# Patient Record
Sex: Female | Born: 1985 | Race: Asian | Hispanic: No | Marital: Single | Smoking: Never smoker
Health system: Southern US, Community
[De-identification: ages and names within clinical notes are randomized; demographics above are authoritative.]

## PROBLEM LIST (undated history)

## (undated) ENCOUNTER — Inpatient Hospital Stay (HOSPITAL_COMMUNITY): Payer: 59

## (undated) ENCOUNTER — Inpatient Hospital Stay (HOSPITAL_COMMUNITY): Payer: Self-pay

## (undated) DIAGNOSIS — Z789 Other specified health status: Secondary | ICD-10-CM

## (undated) HISTORY — PX: ABDOMINAL HYSTERECTOMY: SHX81

---

## 2010-12-13 ENCOUNTER — Encounter: Payer: Self-pay | Admitting: Obstetrics and Gynecology

## 2011-03-21 ENCOUNTER — Inpatient Hospital Stay (HOSPITAL_COMMUNITY)
Admission: AD | Admit: 2011-03-21 | Discharge: 2011-03-25 | DRG: 766 | Disposition: A | Discharge status: Home / Self Care | Payer: Medicaid Other | Source: Ambulatory Visit | Attending: Obstetrics and Gynecology | Admitting: Obstetrics and Gynecology

## 2011-03-21 ENCOUNTER — Encounter (HOSPITAL_COMMUNITY): Payer: Self-pay | Admitting: Obstetrics and Gynecology

## 2011-03-21 DIAGNOSIS — O480 Post-term pregnancy: Principal | ICD-10-CM | POA: Diagnosis present

## 2011-03-21 LAB — CBC
HCT: 39.2 % (ref 36.0–46.0)
Hemoglobin: 13.1 g/dL (ref 12.0–15.0)
MCH: 25.8 pg — ABNORMAL LOW (ref 26.0–34.0)
MCHC: 33.4 g/dL (ref 30.0–36.0)
RBC: 5.07 MIL/uL (ref 3.87–5.11)
RDW: 17 % — ABNORMAL HIGH (ref 11.5–15.5)

## 2011-03-23 LAB — CBC
MCH: 25.5 pg — ABNORMAL LOW (ref 26.0–34.0)
MCHC: 31.7 g/dL (ref 30.0–36.0)
MCV: 80.6 fL (ref 78.0–100.0)
Platelets: 238 10*3/uL (ref 150–400)
RDW: 16.7 % — ABNORMAL HIGH (ref 11.5–15.5)
WBC: 14.9 10*3/uL — ABNORMAL HIGH (ref 4.0–10.5)

## 2011-03-23 LAB — ABO/RH: ABO/RH(D): B NEG

## 2011-04-14 NOTE — Op Note (Signed)
NAME:  Kelly Patel, ZUCCARO NO.:  [id number]  MEDICAL RECORD NO.:  [id number]           PATIENT TYPE:  I  LOCATION:  9106                          FACILITY:  [facility]  PHYSICIAN:  Arlyce Harman, MD     DATE OF BIRTH:  Jul 06, 1986  DATE OF PROCEDURE:  03/22/2011 DATE OF DISCHARGE:                              OPERATIVE REPORT   PROCEDURE:  Primary low-transverse cervical cesarean section.  PREOPERATIVE DIAGNOSES:  41 plus week pregnancy, failed induction due to failure to progress and nonreassuring fetal heart rate tracing.  POSTOPERATIVE DIAGNOSES:  41 plus week pregnancy, failed induction due to failure to progress and nonreassuring fetal heart rate tracing plus nuchal cord x1 and persistent occiput posterior.  SURGEON:  Arlyce Harman, MD  ASSISTANT:  No assistant.  ESTIMATED BLOOD LOSS:  1000 mL.  INDICATIONS AND CONSENT:  The patient is a 25 year old gravida 1, para 0, who was due on March 14, 2011.  Prenatal course has been uncomplicated. The patient was brought in for induction due to postdates.  Cervidil was administered on Sunday night and this was followed by high dose Pitocin augmentation.  The following morning, labor progressed to 6 cm and then persistent severe variable to late decelerations were noted. The Pitocin was turned off, oxygen administered, hydration and positioning to the side was performed and an amnio infusion was started and the tracing became normal.  After an adequate period of time, the Pitocin was restarted and initially the tracing was reassuring, but after about 2 hours of labor, the decelerations reoccurred, and were persistent and severe.  Cervical check revealed no cervical change and there was a thickening of the anterior lip that was not was not noted previously.  Because of the failure to progress as well as a nonreassuring fetal heart rate tracing, a cesarean section was elected and the patient and mother were informed of  the indications and consent was given.  PROCEDURE:  The patient was placed on the table in a supine position with wedging after an adequate level of epidural anesthesia had been obtained.  The abdomen was sterilely prepped and draped in the usual fashion and a Pfannenstiel incision was made into the skin and extended through the abdominal layers without difficulty.  The uterus was entered in a low transverse cervical fashion.  The membranes were brought forth and ruptured.  The fluid was clear.  Then, the infant's head was delivered.  It was in a persistent occiput posterior position.  The infant's head was delivered and the nuchal cord x1 was released.  The nose and mouth was suctioned out and the remainder of the infant was delivered without difficulty.  It was a female with Apgars of 8 and 9.  Weight was later found to be 6 pounds 13 ounces.  After the cord had been clamped and cut, the infant was handed off to the nurse and the placenta was manually removed after a tube of blood was obtained for typing because of the mother's Rh negative status.  After the placenta had been removed, the uterus was sponged out and the uterus was closed with  0 Vicryl in a running locked fashion.  The second layer was that of a modified Lambert stitch.  Hemostasis was excellent.  The abdomen was irrigated and suctioned out and the peritoneum was closed with chromic in a running fashion.  The fascia was closed with 0 Vicryl in a running locked fashion.  The incision was then irrigated and then sponged out. The subcuticular layer was closed with 2-0 plain in a running fashion. The skin was approximated using a Mellody Dance needle and a 4-0 Vicryl suture in a subcuticular fashion and the incision was covered with Steri-Strips and a pressure dressing.  The patient was taken off the table and taken to recovery in good condition.  The uterus was somewhat boggy, so Pitocin was given and then postpartum 0.2 mg of IM  Methergine was given with good response.  ESTIMATED BLOOD LOSS:  1000 mL.  SPECIMEN:  Placenta.  DISPOSITION:  Sent to Labor and Delivery.  COMPLICATIONS:  None.     Arlyce Harman, MD     EG/MEDQ  D:  03/23/2011  T:  03/23/2011  Job:  [id number]  Electronically Signed by Arlyce Harman MD on 04/14/2011 84:13:24 PM

## 2011-04-14 NOTE — Discharge Summary (Signed)
  NAME:  Kelly Patel, Kelly Patel NO.:  [id number]  MEDICAL RECORD NO.:  [id number]           PATIENT TYPE:  I  LOCATION:  9106                          [facility]ACILITY:  [facility]  PHYSICIAN:  Arlyce Harman, MD     DATE OF BIRTH:  [birth year]  DATE OF ADMISSION:  03/21/2011 DATE OF DISCHARGE:  03/25/2011                              DISCHARGE SUMMARY   HOSPITAL COURSE:  The patient was 41+ weeks and was admitted for induction due to post dates.  Cervidil was instilled in the vagina overnight and Pitocin induction was started the following morning. Despite adequate labor with Pitocin, the cervix failed to progress past 6 cm.  In addition, she had a couple of episodes of severe decelerations of the fetal heart rate tracing, the first resolved with Aminofusion hydration, oxygen, and positioning.  When she had the second severe deceleration, the decision was made to examine the cervix and the cervix had not changed in over 3 hours.  Therefore, cesarean section was done due to failure to progress and nonreassuring fetal heart rate tracing. She underwent low-transverse cervical cesarean section on March 22, 2011, around 11:33 p.m. and delivered a viable female infant with good Apgars.  There was a nuchal cord x1 and a persistent occiput posterior. Postoperative course has been uncomplicated and the patient is being discharged on the third postop day in good condition and will be followed in our office in 2 weeks to check her incision.  Discharge medications include Percocet 5 mg, she has 30 tablets, she will take 1-2 every 4-6 hours as needed for pain and if not needed, she can use Motrin instead over the counter.  She has a prescription for ferrous sulfate 325 mg p.o. b.i.d. and prenatal vitamins once a day.  FINAL DIAGNOSES: 1. Failed induction. 2. Failure to progress. 3. Nonreassuring fetal heart rate tracing.  PERTINENT LABORATORY DATA:  Hemoglobin on Mar 23, 2011, is 10.1.  She  is Rh negative.  SIGNIFICANT FINDINGS:  None.  PROCEDURES PERFORMED:  Failed induction and primary low-transverse cervical cesarean section.  CONDITION OF THE PATIENT ON DISCHARGE:  Excellent.  She is afebrile and ambulatory without complaints.  INSTRUCTIONS GIVEN TO THE PATIENT:  Come to office in 2 weeks for incision check.  No heavy lifting heavier than the baby.  Call office for temperature of 101, excessive bleeding, or severe pain.  No coitus until after 6 weeks.  DIET:  Regular.     Arlyce Harman, MD     EG/MEDQ  D:  03/25/2011  T:  03/25/2011  Job:  [id number]  Electronically Signed by Arlyce Harman MD on 04/14/2011 06:21:27 PM

## 2011-10-11 ENCOUNTER — Other Ambulatory Visit (HOSPITAL_COMMUNITY)
Admission: RE | Admit: 2011-10-11 | Discharge: 2011-10-11 | Disposition: A | Discharge status: Home / Self Care | Payer: Medicaid Other | Source: Ambulatory Visit | Attending: Obstetrics and Gynecology | Admitting: Obstetrics and Gynecology

## 2011-10-11 DIAGNOSIS — Z124 Encounter for screening for malignant neoplasm of cervix: Secondary | ICD-10-CM | POA: Insufficient documentation

## 2014-09-23 ENCOUNTER — Encounter (HOSPITAL_COMMUNITY): Payer: Self-pay | Admitting: Obstetrics and Gynecology

## 2016-08-24 ENCOUNTER — Inpatient Hospital Stay (HOSPITAL_COMMUNITY): Payer: Medicaid Other

## 2016-08-24 ENCOUNTER — Encounter (HOSPITAL_COMMUNITY): Payer: Self-pay | Admitting: *Deleted

## 2016-08-24 ENCOUNTER — Inpatient Hospital Stay (HOSPITAL_COMMUNITY)
Admission: AD | Admit: 2016-08-24 | Discharge: 2016-08-24 | Disposition: A | Discharge status: Home / Self Care | Payer: Medicaid Other | Source: Ambulatory Visit | Attending: Obstetrics and Gynecology | Admitting: Obstetrics and Gynecology

## 2016-08-24 DIAGNOSIS — B3731 Acute candidiasis of vulva and vagina: Secondary | ICD-10-CM

## 2016-08-24 DIAGNOSIS — Z3A01 Less than 8 weeks gestation of pregnancy: Secondary | ICD-10-CM | POA: Insufficient documentation

## 2016-08-24 DIAGNOSIS — R300 Dysuria: Secondary | ICD-10-CM | POA: Diagnosis present

## 2016-08-24 DIAGNOSIS — O2341 Unspecified infection of urinary tract in pregnancy, first trimester: Secondary | ICD-10-CM | POA: Diagnosis not present

## 2016-08-24 DIAGNOSIS — R109 Unspecified abdominal pain: Secondary | ICD-10-CM

## 2016-08-24 DIAGNOSIS — O219 Vomiting of pregnancy, unspecified: Secondary | ICD-10-CM

## 2016-08-24 DIAGNOSIS — O26899 Other specified pregnancy related conditions, unspecified trimester: Secondary | ICD-10-CM

## 2016-08-24 DIAGNOSIS — O208 Other hemorrhage in early pregnancy: Secondary | ICD-10-CM | POA: Insufficient documentation

## 2016-08-24 DIAGNOSIS — B373 Candidiasis of vulva and vagina: Secondary | ICD-10-CM | POA: Diagnosis not present

## 2016-08-24 DIAGNOSIS — O98811 Other maternal infectious and parasitic diseases complicating pregnancy, first trimester: Secondary | ICD-10-CM | POA: Diagnosis not present

## 2016-08-24 DIAGNOSIS — O418X10 Other specified disorders of amniotic fluid and membranes, first trimester, not applicable or unspecified: Secondary | ICD-10-CM

## 2016-08-24 DIAGNOSIS — Z3491 Encounter for supervision of normal pregnancy, unspecified, first trimester: Secondary | ICD-10-CM

## 2016-08-24 DIAGNOSIS — O468X1 Other antepartum hemorrhage, first trimester: Secondary | ICD-10-CM

## 2016-08-24 HISTORY — DX: Other specified health status: Z78.9

## 2016-08-24 LAB — CBC
HCT: 38.7 % (ref 36.0–46.0)
Hemoglobin: 14.1 g/dL (ref 12.0–15.0)
MCH: 29.4 pg (ref 26.0–34.0)
MCHC: 36.4 g/dL — ABNORMAL HIGH (ref 30.0–36.0)
MCV: 80.8 fL (ref 78.0–100.0)
Platelets: 204 10*3/uL (ref 150–400)
RBC: 4.79 MIL/uL (ref 3.87–5.11)
RDW: 13.3 % (ref 11.5–15.5)
WBC: 8.3 10*3/uL (ref 4.0–10.5)

## 2016-08-24 LAB — URINALYSIS, ROUTINE W REFLEX MICROSCOPIC
Bilirubin Urine: NEGATIVE
Glucose, UA: NEGATIVE mg/dL
KETONES UR: NEGATIVE mg/dL
Nitrite: POSITIVE — AB
PROTEIN: NEGATIVE mg/dL
Specific Gravity, Urine: 1.01 (ref 1.005–1.030)
pH: 6.5 (ref 5.0–8.0)

## 2016-08-24 LAB — URINE MICROSCOPIC-ADD ON

## 2016-08-24 LAB — WET PREP, GENITAL
CLUE CELLS WET PREP: NONE SEEN
SPERM: NONE SEEN
TRICH WET PREP: NONE SEEN

## 2016-08-24 LAB — HCG, QUANTITATIVE, PREGNANCY: hCG, Beta Chain, Quant, S: 39902 m[IU]/mL — ABNORMAL HIGH (ref <5)

## 2016-08-24 LAB — POCT PREGNANCY, URINE: Preg Test, Ur: POSITIVE — AB

## 2016-08-24 MED ORDER — CEPHALEXIN 500 MG PO CAPS: 500.0000 mg | ORAL_CAPSULE | Freq: Four times a day (QID) | ORAL | 0 refills | Status: DC

## 2016-08-24 MED ORDER — PROMETHAZINE HCL 12.5 MG PO TABS: 12.5000 mg | ORAL_TABLET | Freq: Four times a day (QID) | ORAL | 0 refills | Status: DC | PRN

## 2016-08-24 MED ORDER — TERCONAZOLE 0.8 % VA CREA: 1.0000 | TOPICAL_CREAM | Freq: Every day | VAGINAL | 0 refills | Status: DC

## 2016-08-24 NOTE — MAU Provider Note (Signed)
History     CSN: [id number]  Arrival date and time: 08/24/16 1307   First Provider Initiated Contact with Patient 08/24/16 1653       Chief Complaint  Patient presents with  . Emesis  . Dysuria   HPI Kelly Patel is a 30 y.o. G2P1001 at [redacted]w[redacted]d by LMP who presents with dysuria, abdominal cramping, & n/v. Found out she was pregnant last month; does not know where she will go for prenatal care.  Symptoms began over the weekend with abdominal cramping & dysuria. Rates pain 4/10. Has not treated. Nothing makes better or worse. Denies hematuria, frequency, fever/chills, flank pain, or vaginal bleeding. Has had nausea & vomiting throughout pregnancy. Has vomited twice today. No heartburn, diarrhea, or constipation.  OB History    Gravida Para Term Preterm AB Living   2 1 1     1    SAB TAB Ectopic Multiple Live Births           1      Past Medical History:  Diagnosis Date  . Medical history non-contributory     Past Surgical History:  Procedure Laterality Date  . CESAREAN SECTION      History reviewed. No pertinent family history.  Social History  Substance Use Topics  . Smoking status: Never Smoker  . Smokeless tobacco: Never Used  . Alcohol use No    Allergies: No Known Allergies  Prescriptions Prior to Admission  Medication Sig Dispense Refill Last Dose  . OVER THE COUNTER MEDICATION Take 1 tablet by mouth daily as needed (nausea). Nausea medication   08/23/2016 at Unknown time    Review of Systems  Constitutional: Negative.   Gastrointestinal: Positive for abdominal pain, nausea and vomiting. Negative for constipation and diarrhea.  Genitourinary: Positive for dysuria. Negative for flank pain, frequency and hematuria.       No vaginal bleeding   Physical Exam   Blood pressure 104/57, pulse 66, temperature 98.5 F (36.9 C), temperature source Oral, resp. rate 16, height 5' (1.524 m), weight 152 lb (68.9 kg), last menstrual period 07/19/2016, unknown if currently  breastfeeding.  Physical Exam  Nursing note and vitals reviewed. Constitutional: She is oriented to person, place, and time. She appears well-developed and well-nourished. No distress.  HENT:  Head: Normocephalic and atraumatic.  Eyes: Conjunctivae are normal. Right eye exhibits no discharge. Left eye exhibits no discharge. No scleral icterus.  Neck: Normal range of motion.  Cardiovascular: Normal rate.   Respiratory: Effort normal. No respiratory distress.  GI: Soft. She exhibits no distension. There is no tenderness. There is no rebound, no guarding and no CVA tenderness.  Genitourinary: Uterus is enlarged. Cervix exhibits no motion tenderness. Right adnexum displays no mass and no tenderness. Left adnexum displays no mass and no tenderness.  Genitourinary Comments: Cervix closed  Neurological: She is alert and oriented to person, place, and time.  Skin: Skin is warm and dry. She is not diaphoretic.  Psychiatric: She has a normal mood and affect. Her behavior is normal. Judgment and thought content normal.    MAU Course  Procedures Results for orders placed or performed during the hospital encounter of 08/24/16 (from the past 24 hour(s))  Urinalysis, Routine w reflex microscopic (not at [facility])     Status: Abnormal   Collection Time: 08/24/16  1:31 PM  Result Value Ref Range   Color, Urine YELLOW YELLOW   APPearance CLEAR CLEAR   Specific Gravity, Urine 1.010 1.005 - 1.030   pH 6.5 5.0 -  8.0   Glucose, UA NEGATIVE NEGATIVE mg/dL   Hgb urine dipstick SMALL (A) NEGATIVE   Bilirubin Urine NEGATIVE NEGATIVE   Ketones, ur NEGATIVE NEGATIVE mg/dL   Protein, ur NEGATIVE NEGATIVE mg/dL   Nitrite POSITIVE (A) NEGATIVE   Leukocytes, UA SMALL (A) NEGATIVE  Urine microscopic-add on     Status: Abnormal   Collection Time: 08/24/16  1:31 PM  Result Value Ref Range   Squamous Epithelial / LPF 0-5 (A) NONE SEEN   WBC, UA 0-5 0 - 5 WBC/hpf   RBC / HPF 0-5 0 - 5 RBC/hpf   Bacteria, UA MANY  (A) NONE SEEN   Urine-Other MUCOUS PRESENT   Pregnancy, urine POC     Status: Abnormal   Collection Time: 08/24/16  2:08 PM  Result Value Ref Range   Preg Test, Ur POSITIVE (A) NEGATIVE  CBC     Status: Abnormal   Collection Time: 08/24/16  3:04 PM  Result Value Ref Range   WBC 8.3 4.0 - 10.5 K/uL   RBC 4.79 3.87 - 5.11 MIL/uL   Hemoglobin 14.1 12.0 - 15.0 g/dL   HCT 09.838.7 11.936.0 - 14.746.0 %   MCV 80.8 78.0 - 100.0 fL   MCH 29.4 26.0 - 34.0 pg   MCHC 36.4 (H) 30.0 - 36.0 g/dL   RDW 82.913.3 56.211.5 - 13.015.5 %   Platelets 204 150 - 400 K/uL  hCG, quantitative, pregnancy     Status: Abnormal   Collection Time: 08/24/16  3:04 PM  Result Value Ref Range   hCG, Beta Chain, Quant, S 39,902 (H) <5 mIU/mL  Wet prep, genital     Status: Abnormal   Collection Time: 08/24/16  5:00 PM  Result Value Ref Range   Yeast Wet Prep HPF POC PRESENT (A) NONE SEEN   Trich, Wet Prep NONE SEEN NONE SEEN   Clue Cells Wet Prep HPF POC NONE SEEN NONE SEEN   WBC, Wet Prep HPF POC MANY (A) NONE SEEN   Sperm NONE SEEN    [place] Ob Comp Less 14 Wks  Result Date: 08/24/2016 CLINICAL DATA:  Abdominal pain for 3 days. Quantitative beta HCG [id number]. EXAM: OBSTETRIC <14 WK US AND TRANSVAGINAL OB US TECHNIQUE: Both transabdominal and transvaginal ultrasound examinations were performed for complete evaluation of the gestation as well as the maternal uterus, adnexal regions, and pelvic cul-de-sac. Transvaginal technique was performed to assess early pregnancy. COMPARISON:  None. FINDINGS: Intrauterine gestational sac: Present Yolk sac:  Present Embryo:  Present Cardiac Activity: Present Heart Rate: 103  bpm CRL:  3.0 mm  5 w   6 d                  US EDC: 04/20/2017 Subchorionic hemorrhage:  There is a small subchorionic hemorrhage. Maternal uterus/adnexae: Normal appearance of bilateral ovaries. IMPRESSION: Single live intrauterine pregnancy corresponding to 5 weeks 6 days gestation. Small subchorionic hemorrhage. Embryonic bradycardia.   Close clinical follow-up is recommended. Electronically Signed   By: Ted Mcalpineobrinka  Dimitrova M.D.   On: 08/24/2016 17:45   [place] Ob Transvaginal  Result Date: 08/24/2016 CLINICAL DATA:  Abdominal pain for 3 days. Quantitative beta HCG [id number]. EXAM: OBSTETRIC <14 WK US AND TRANSVAGINAL OB US TECHNIQUE: Both transabdominal and transvaginal ultrasound examinations were performed for complete evaluation of the gestation as well as the maternal uterus, adnexal regions, and pelvic cul-de-sac. Transvaginal technique was performed to assess early pregnancy. COMPARISON:  None. FINDINGS: Intrauterine gestational sac: Present Yolk sac:  Present Embryo:  Present Cardiac Activity: Present Heart Rate: 103  bpm CRL:  3.0 mm  5 w   6 d                  [place] EDC: 04/20/2017 Subchorionic hemorrhage:  There is a small subchorionic hemorrhage. Maternal uterus/adnexae: Normal appearance of bilateral ovaries. IMPRESSION: Single live intrauterine pregnancy corresponding to 5 weeks 6 days gestation. Small subchorionic hemorrhage. Embryonic bradycardia.  Close clinical follow-up is recommended. Electronically Signed   By: Ted Mcalpine M.D.   On: 08/24/2016 17:45    MDM +UPT UA, wet prep, GC/chlamydia, CBC, ABO/Rh, quant hCG, HIV, and [place] today to rule out ectopic pregnancy U/a +nitrites -- will tx for uti & send urine for culture Ultrasound shows SIUP at [redacted]w[redacted]d c/w LMP with cardiac activity and small [facility] Wet prep +yeast Assessment and Plan  A: 1. Urinary tract infection in mother during first trimester of pregnancy   2. Abdominal pain affecting pregnancy   3. Vaginal yeast infection   4. Normal IUP (intrauterine pregnancy) on prenatal ultrasound, first trimester   5. Subchorionic hematoma in first trimester, single or unspecified fetus   6. Nausea and vomiting during pregnancy prior to [redacted] weeks gestation     P: Discharge home Rx phenergan, keflex, & terazol Urine culture pending GC/CT pending Discussed reasons to  return to MAU Start prenatal care   Judeth Horn 08/24/2016, 4:52 PM

## 2016-08-24 NOTE — Discharge Instructions (Signed)
Subchorionic Hematoma A subchorionic hematoma is a gathering of blood between the outer wall of the placenta and the inner wall of the womb (uterus). The placenta is the organ that connects the fetus to the wall of the uterus. The placenta performs the feeding, breathing (oxygen to the fetus), and waste removal (excretory work) of the fetus.  Subchorionic hematoma is the most common abnormality found on a result from ultrasonography done during the first trimester or early second trimester of pregnancy. If there has been little or no vaginal bleeding, early small hematomas usually shrink on their own and do not affect your baby or pregnancy. The blood is gradually absorbed over 1-2 weeks. When bleeding starts later in pregnancy or the hematoma is larger or occurs in an older pregnant woman, the outcome may not be as good. Larger hematomas may get bigger, which increases the chances for miscarriage. Subchorionic hematoma also increases the risk of premature detachment of the placenta from the uterus, preterm (premature) labor, and stillbirth. HOME CARE INSTRUCTIONS  Stay on bed rest if your health care provider recommends this. Although bed rest will not prevent more bleeding or prevent a miscarriage, your health care provider may recommend bed rest until you are advised otherwise.  Avoid heavy lifting (more than 10 lb [4.5 kg]), exercise, sexual intercourse, or douching as directed by your health care provider.  Keep track of the number of pads you use each day and how soaked (saturated) they are. Write down this information.  Do not use tampons.  Keep all follow-up appointments as directed by your health care provider. Your health care provider may ask you to have follow-up blood tests or ultrasound tests or both. SEEK IMMEDIATE MEDICAL CARE IF:  You have severe cramps in your stomach, back, abdomen, or pelvis.  You have a fever.  You pass large clots or tissue. Save any tissue for your health  care provider to look at.  Your bleeding increases or you become lightheaded, feel weak, or have fainting episodes.   This information is not intended to replace advice given to you by your health care provider. Make sure you discuss any questions you have with your health care provider.   Document Released: 02/23/2007 Document Revised: 11/29/2014 Document Reviewed: 06/07/2013 Elsevier Interactive Patient Education 2016 [employer] Monilial Vaginitis Vaginitis in a soreness, swelling and redness (inflammation) of the vagina and vulva. Monilial vaginitis is not a sexually transmitted infection. CAUSES  Yeast vaginitis is caused by yeast (candida) that is normally found in your vagina. With a yeast infection, the candida has overgrown in number to a point that upsets the chemical balance. SYMPTOMS   White, thick vaginal discharge.  Swelling, itching, redness and irritation of the vagina and possibly the lips of the vagina (vulva).  Burning or painful urination.  Painful intercourse. DIAGNOSIS  Things that may contribute to monilial vaginitis are:  Postmenopausal and virginal states.  Pregnancy.  Infections.  Being tired, sick or stressed, especially if you had monilial vaginitis in the past.  Diabetes. Good control will help lower the chance.  Birth control pills.  Tight fitting garments.  Using bubble bath, feminine sprays, douches or deodorant tampons.  Taking certain medications that kill germs (antibiotics).  Sporadic recurrence can occur if you become ill. TREATMENT  Your caregiver will give you medication.  There are several kinds of anti monilial vaginal creams and suppositories specific for monilial vaginitis. For recurrent yeast infections, use a suppository or cream in the vagina 2 times a  week, or as directed.  Anti-monilial or steroid cream for the itching or irritation of the vulva may also be used. Get your caregiver's permission.  Painting the vagina  with methylene blue solution may help if the monilial cream does not work.  Eating yogurt may help prevent monilial vaginitis. HOME CARE INSTRUCTIONS   Finish all medication as prescribed.  Do not have sex until treatment is completed or after your caregiver tells you it is okay.  Take warm sitz baths.  Do not douche.  Do not use tampons, especially scented ones.  Wear cotton underwear.  Avoid tight pants and panty hose.  Tell your sexual partner that you have a yeast infection. They should go to their caregiver if they have symptoms such as mild rash or itching.  Your sexual partner should be treated as well if your infection is difficult to eliminate.  Practice safer sex. Use condoms.  Some vaginal medications cause latex condoms to fail. Vaginal medications that harm condoms are:  Cleocin cream.  Butoconazole (Femstat).  Terconazole (Terazol) vaginal suppository.  Miconazole (Monistat) (may be purchased over the counter). SEEK MEDICAL CARE IF:   You have a temperature by mouth above 102 F (38.9 C).  The infection is getting worse after 2 days of treatment.  The infection is not getting better after 3 days of treatment.  You develop blisters in or around your vagina.  You develop vaginal bleeding, and it is not your menstrual period.  You have pain when you urinate.  You develop intestinal problems.  You have pain with sexual intercourse.   This information is not intended to replace advice given to you by your health care provider. Make sure you discuss any questions you have with your health care provider.   Document Released: 08/18/2005 Document Revised: 01/31/2012 Document Reviewed: 05/12/2015 Elsevier Interactive Patient Education 2016 [employer] Inc[employer] Pregnancy and Urinary Tract Infection A urinary tract infection (UTI) is a bacterial infection of the urinary tract. Infection of the urinary tract can include the ureters, kidneys (pyelonephritis),  bladder (cystitis), and urethra (urethritis). All pregnant women should be screened for bacteria in the urinary tract. Identifying and treating a UTI will decrease the risk of preterm labor and developing more serious infections in both the mother and baby. CAUSES Bacteria germs cause almost all UTIs.  RISK FACTORS Many factors can increase your chances of getting a UTI during pregnancy. These include:  Having a short urethra.  Poor toilet and hygiene habits.  Sexual intercourse.  Blockage of urine along the urinary tract.  Problems with the pelvic muscles or nerves.  Diabetes.  Obesity.  Bladder problems after having several children.  Previous history of UTI. SIGNS AND SYMPTOMS   Pain, burning, or a stinging feeling when urinating.  Suddenly feeling the need to urinate right away (urgency).  Loss of bladder control (urinary incontinence).  Frequent urination, more than is common with pregnancy.  Lower abdominal or back discomfort.  Cloudy urine.  Blood in the urine (hematuria).  Fever. When the kidneys are infected, the symptoms may be:  Back pain.  Flank pain on the right side more so than the left.  Fever.  Chills.  Nausea.  Vomiting. DIAGNOSIS  A urinary tract infection is usually diagnosed through urine tests. Additional tests and procedures are sometimes done. These may include:  Ultrasound exam of the kidneys, ureters, bladder, and urethra.  Looking in the bladder with a lighted tube (cystoscopy). TREATMENT Typically, UTIs can be treated with antibiotic medicines.  HOME CARE INSTRUCTIONS   Only take over-the-counter or prescription medicines as directed by your health care provider. If you were prescribed antibiotics, take them as directed. Finish them even if you start to feel better.  Drink enough fluids to keep your urine clear or pale yellow.  Do not have sexual intercourse until the infection is gone and your health care provider says  it is okay.  Make sure you are tested for UTIs throughout your pregnancy. These infections often come back. Preventing a UTI in the Future  Practice good toilet habits. Always wipe from front to back. Use the tissue only once.  Do not hold your urine. Empty your bladder as soon as possible when the urge comes.  Do not douche or use deodorant sprays.  Wash with soap and warm water around the genital area and the anus.  Empty your bladder before and after sexual intercourse.  Wear underwear with a cotton crotch.  Avoid caffeine and carbonated drinks. They can irritate the bladder.  Drink cranberry juice or take cranberry pills. This may decrease the risk of getting a UTI.  Do not drink alcohol.  Keep all your appointments and tests as scheduled. SEEK MEDICAL CARE IF:   Your symptoms get worse.  You are still having fevers 2 or more days after treatment begins.  You have a rash.  You feel that you are having problems with medicines prescribed.  You have abnormal vaginal discharge. SEEK IMMEDIATE MEDICAL CARE IF:   You have back or flank pain.  You have chills.  You have blood in your urine.  You have nausea and vomiting.  You have contractions of your uterus.  You have a gush of fluid from the vagina. MAKE SURE YOU:  Understand these instructions.   Will watch your condition.   Will get help right away if you are not doing well or get worse.    This information is not intended to replace advice given to you by your health care provider. Make sure you discuss any questions you have with your health care provider.   Document Released: 03/05/2011 Document Revised: 08/29/2013 Document Reviewed: 06/07/2013 Elsevier Interactive Patient Education [employer]

## 2016-08-24 NOTE — MAU Note (Signed)
Pt C/O N&V for the last week, [place] a clinic [place].  Has mild lower abd pain today, denies bleeding. Has burning & itching with urination.

## 2016-08-25 LAB — GC/CHLAMYDIA PROBE AMP (~~LOC~~) NOT AT ARMC
Chlamydia: NEGATIVE
Neisseria Gonorrhea: NEGATIVE

## 2016-08-26 LAB — CULTURE, OB URINE

## 2017-06-29 ENCOUNTER — Encounter (HOSPITAL_COMMUNITY): Payer: Self-pay

## 2020-04-18 ENCOUNTER — Inpatient Hospital Stay (HOSPITAL_BASED_OUTPATIENT_CLINIC_OR_DEPARTMENT_OTHER)
Admission: EM | Admit: 2020-04-18 | Discharge: 2020-04-24 | DRG: 871 | Disposition: A | Discharge status: Home / Self Care | Payer: 59 | Attending: Internal Medicine | Admitting: Internal Medicine

## 2020-04-18 ENCOUNTER — Encounter (HOSPITAL_BASED_OUTPATIENT_CLINIC_OR_DEPARTMENT_OTHER): Payer: Self-pay

## 2020-04-18 ENCOUNTER — Emergency Department (HOSPITAL_BASED_OUTPATIENT_CLINIC_OR_DEPARTMENT_OTHER): Payer: 59

## 2020-04-18 ENCOUNTER — Other Ambulatory Visit: Payer: Self-pay

## 2020-04-18 DIAGNOSIS — E860 Dehydration: Secondary | ICD-10-CM | POA: Diagnosis present

## 2020-04-18 DIAGNOSIS — R7401 Elevation of levels of liver transaminase levels: Secondary | ICD-10-CM | POA: Diagnosis present

## 2020-04-18 DIAGNOSIS — U071 COVID-19: Secondary | ICD-10-CM | POA: Diagnosis present

## 2020-04-18 DIAGNOSIS — A4189 Other specified sepsis: Principal | ICD-10-CM | POA: Diagnosis present

## 2020-04-18 DIAGNOSIS — J069 Acute upper respiratory infection, unspecified: Secondary | ICD-10-CM | POA: Diagnosis present

## 2020-04-18 DIAGNOSIS — J1282 Pneumonia due to coronavirus disease 2019: Secondary | ICD-10-CM | POA: Diagnosis not present

## 2020-04-18 DIAGNOSIS — J9601 Acute respiratory failure with hypoxia: Secondary | ICD-10-CM | POA: Diagnosis present

## 2020-04-18 DIAGNOSIS — R0602 Shortness of breath: Secondary | ICD-10-CM

## 2020-04-18 DIAGNOSIS — Z833 Family history of diabetes mellitus: Secondary | ICD-10-CM | POA: Diagnosis not present

## 2020-04-18 DIAGNOSIS — R7989 Other specified abnormal findings of blood chemistry: Secondary | ICD-10-CM | POA: Diagnosis not present

## 2020-04-18 LAB — CBC WITH DIFFERENTIAL/PLATELET
Abs Immature Granulocytes: 0.07 10*3/uL (ref 0.00–0.07)
Basophils Absolute: 0 10*3/uL (ref 0.0–0.1)
Basophils Relative: 0 %
Eosinophils Absolute: 0.1 10*3/uL (ref 0.0–0.5)
Eosinophils Relative: 2 %
HCT: 41.4 % (ref 36.0–46.0)
Hemoglobin: 13.7 g/dL (ref 12.0–15.0)
Immature Granulocytes: 1 %
Lymphocytes Relative: 18 %
Lymphs Abs: 1 10*3/uL (ref 0.7–4.0)
MCH: 27.8 pg (ref 26.0–34.0)
MCHC: 33.1 g/dL (ref 30.0–36.0)
MCV: 84.1 fL (ref 80.0–100.0)
Monocytes Absolute: 0.3 10*3/uL (ref 0.1–1.0)
Monocytes Relative: 6 %
Neutro Abs: 3.7 10*3/uL (ref 1.7–7.7)
Neutrophils Relative %: 73 %
Platelets: 323 10*3/uL (ref 150–400)
RBC: 4.92 MIL/uL (ref 3.87–5.11)
RDW: 13.6 % (ref 11.5–15.5)
WBC: 5.2 10*3/uL (ref 4.0–10.5)
nRBC: 0 % (ref 0.0–0.2)

## 2020-04-18 LAB — COMPREHENSIVE METABOLIC PANEL
ALT: 89 U/L — ABNORMAL HIGH (ref 0–44)
AST: 35 U/L (ref 15–41)
Albumin: 3 g/dL — ABNORMAL LOW (ref 3.5–5.0)
Alkaline Phosphatase: 87 U/L (ref 38–126)
Anion gap: 13 (ref 5–15)
BUN: 17 mg/dL (ref 6–20)
CO2: 25 mmol/L (ref 22–32)
Calcium: 8.3 mg/dL — ABNORMAL LOW (ref 8.9–10.3)
Chloride: 100 mmol/L (ref 98–111)
Creatinine, Ser: 0.55 mg/dL (ref 0.44–1.00)
GFR calc Af Amer: >60 mL/min (ref >60)
GFR calc non Af Amer: >60 mL/min (ref >60)
Glucose, Bld: 118 mg/dL — ABNORMAL HIGH (ref 70–99)
Potassium: 3.8 mmol/L (ref 3.5–5.1)
Sodium: 138 mmol/L (ref 135–145)
Total Bilirubin: 1.2 mg/dL (ref 0.3–1.2)
Total Protein: 7.4 g/dL (ref 6.5–8.1)

## 2020-04-18 LAB — TRIGLYCERIDES: Triglycerides: 253 mg/dL — ABNORMAL HIGH (ref <150)

## 2020-04-18 LAB — C-REACTIVE PROTEIN: CRP: 11.8 mg/dL — ABNORMAL HIGH (ref <1.0)

## 2020-04-18 LAB — FERRITIN: Ferritin: 457 ng/mL — ABNORMAL HIGH (ref 11–307)

## 2020-04-18 LAB — LACTATE DEHYDROGENASE: LDH: 354 U/L — ABNORMAL HIGH (ref 98–192)

## 2020-04-18 LAB — SARS CORONAVIRUS 2 BY RT PCR (HOSPITAL ORDER, PERFORMED IN ~~LOC~~ HOSPITAL LAB): SARS Coronavirus 2: POSITIVE — AB

## 2020-04-18 LAB — D-DIMER, QUANTITATIVE: D-Dimer, Quant: 4.63 ug/mL-FEU — ABNORMAL HIGH (ref 0.00–0.50)

## 2020-04-18 LAB — LACTIC ACID, PLASMA
Lactic Acid, Venous: 2 mmol/L (ref 0.5–1.9)
Lactic Acid, Venous: 1.4 mmol/L (ref 0.5–1.9)

## 2020-04-18 LAB — PROCALCITONIN: Procalcitonin: <0.1 ng/mL

## 2020-04-18 LAB — FIBRINOGEN: Fibrinogen: >800 mg/dL — ABNORMAL HIGH (ref 210–475)

## 2020-04-18 MED ORDER — DEXAMETHASONE SODIUM PHOSPHATE 10 MG/ML IJ SOLN
10.0000 mg | Freq: Once | INTRAMUSCULAR | Status: AC
  Administered 2020-04-18: 10 mg via INTRAVENOUS
  Filled 2020-04-18: qty 1

## 2020-04-18 MED ORDER — SODIUM CHLORIDE 0.9 % IV SOLN
100.0000 mg | Freq: Every day | INTRAVENOUS | Status: AC
  Administered 2020-04-19 – 2020-04-22 (×4): 100 mg via INTRAVENOUS
  Filled 2020-04-18 (×5): qty 20

## 2020-04-18 MED ORDER — SODIUM CHLORIDE 0.9 % IV SOLN: Freq: Once | INTRAVENOUS | Status: AC

## 2020-04-18 MED ORDER — SODIUM CHLORIDE 0.9 % IV SOLN
100.0000 mg | INTRAVENOUS | Status: AC
  Administered 2020-04-18 (×2): 100 mg via INTRAVENOUS
  Filled 2020-04-18 (×2): qty 20

## 2020-04-18 MED ORDER — SODIUM CHLORIDE 0.9 % IV BOLUS
500.0000 mL | Freq: Once | INTRAVENOUS | Status: AC
  Administered 2020-04-18: 500 mL via INTRAVENOUS

## 2020-04-18 NOTE — ED Notes (Signed)
Staff @bedside , Marva charge RN suggested we wait at least 15 min before returning for imaging.

## 2020-04-18 NOTE — ED Provider Notes (Signed)
[facility] EMERGENCY DEPARTMENT Provider Note   CSN: [id number] Arrival date & time: 04/18/20  1637     History Chief Complaint  Patient presents with  . COVID Positive    Kelly Patel is a 34 y.o. female.  He has no significant past medical history.  She tested positive for Covid over 2 weeks ago.  At that point she had some fevers chills nausea.  She also was treated for UTI.  About 5 days ago she has been having increased shortness of breath with any exertion.  Denies any chest pain.  No recent fevers.  Nonproductive cough.  Was breathing 80 times a minute and sats 75% on room air at triage.  Emergently brought back into the emergency department.  The history is provided by the patient and the spouse.  Shortness of Breath Severity:  Severe Onset quality:  Gradual Timing:  Intermittent Progression:  Worsening Chronicity:  New Context: activity   Relieved by:  Rest Worsened by:  Activity Ineffective treatments:  None tried Associated symptoms: no abdominal pain, no chest pain, no fever, no headaches, no neck pain, no rash, no sore throat, no sputum production and no vomiting   Risk factors: no tobacco use        Past Medical History:  Diagnosis Date  . Medical history non-contributory     There are no problems to display for this patient.   Past Surgical History:  Procedure Laterality Date  . CESAREAN SECTION       OB History    Gravida  2   Para  1   Term  1   Preterm      AB      Living  1     SAB      TAB      Ectopic      Multiple      Live Births  1           No family history on file.  Social History   Tobacco Use  . Smoking status: Never Smoker  . Smokeless tobacco: Never Used  Substance Use Topics  . Alcohol use: No  . Drug use: No    Home Medications Prior to Admission medications   Medication Sig Start Date End Date Taking? Authorizing Provider  cephALEXin (KEFLEX) 500 MG capsule Take 1 capsule (500 mg  total) by mouth 4 (four) times daily. 08/24/16   Jorje Guild, NP  OVER THE COUNTER MEDICATION Take 1 tablet by mouth daily as needed (nausea). Nausea medication    [provider]  promethazine (PHENERGAN) 12.5 MG tablet Take 1 tablet (12.5 mg total) by mouth every 6 (six) hours as needed for nausea or vomiting. 08/24/16   Jorje Guild, NP  terconazole (TERAZOL 3) 0.8 % vaginal cream Place 1 applicator vaginally at bedtime. 08/24/16   Jorje Guild, NP    Allergies    Patient has no known allergies.  Review of Systems   Review of Systems  Constitutional: Negative for fever.  HENT: Negative for sore throat.   Eyes: Negative for visual disturbance.  Respiratory: Positive for shortness of breath. Negative for sputum production.   Cardiovascular: Negative for chest pain.  Gastrointestinal: Negative for abdominal pain and vomiting.  Genitourinary: Negative for dysuria.  Musculoskeletal: Negative for neck pain.  Skin: Negative for rash.  Neurological: Negative for headaches.    Physical Exam Updated Vital Signs BP (!) 85/63 (BP Location: Right Arm)   Pulse 100   Temp  98.7 F (37.1 C) (Oral)   Resp (!) 80   SpO2 90%   Physical Exam Vitals and nursing note reviewed.  Constitutional:      General: She is in acute distress.     Appearance: She is well-developed.  HENT:     Head: Normocephalic and atraumatic.  Eyes:     Conjunctiva/sclera: Conjunctivae normal.  Cardiovascular:     Rate and Rhythm: Regular rhythm. Tachycardia present.     Heart sounds: No murmur.  Pulmonary:     Effort: Tachypnea, accessory muscle usage and respiratory distress present.     Breath sounds: Normal breath sounds.  Abdominal:     Palpations: Abdomen is soft.     Tenderness: There is no abdominal tenderness.  Musculoskeletal:        General: No deformity or signs of injury. Normal range of motion.     Cervical back: Neck supple.  Skin:    General: Skin is warm and dry.     Capillary  Refill: Capillary refill takes less than 2 seconds.  Neurological:     General: No focal deficit present.     Mental Status: She is alert.     ED Results / Procedures / Treatments   Labs (all labs ordered are listed, but only abnormal results are displayed) Labs Reviewed  SARS CORONAVIRUS 2 BY RT PCR (HOSPITAL ORDER, PERFORMED IN [facility]) - Abnormal; Notable for the following components:      Result Value   SARS Coronavirus 2 POSITIVE (*)    All other components within normal limits  LACTIC ACID, PLASMA - Abnormal; Notable for the following components:   Lactic Acid, Venous 2.0 (*)    All other components within normal limits  D-DIMER, QUANTITATIVE (NOT AT [facility]) - Abnormal; Notable for the following components:   D-Dimer, Quant 4.63 (*)    All other components within normal limits  LACTATE DEHYDROGENASE - Abnormal; Notable for the following components:   LDH 354 (*)    All other components within normal limits  FERRITIN - Abnormal; Notable for the following components:   Ferritin 457 (*)    All other components within normal limits  TRIGLYCERIDES - Abnormal; Notable for the following components:   Triglycerides 253 (*)    All other components within normal limits  FIBRINOGEN - Abnormal; Notable for the following components:   Fibrinogen >800 (*)    All other components within normal limits  C-REACTIVE PROTEIN - Abnormal; Notable for the following components:   CRP 11.8 (*)    All other components within normal limits  COMPREHENSIVE METABOLIC PANEL - Abnormal; Notable for the following components:   Glucose, Bld 118 (*)    Calcium 8.3 (*)    Albumin 3.0 (*)    ALT 89 (*)    All other components within normal limits  C-REACTIVE PROTEIN - Abnormal; Notable for the following components:   CRP 8.3 (*)    All other components within normal limits  COMPREHENSIVE METABOLIC PANEL - Abnormal; Notable for the following components:   Glucose, Bld 181 (*)    Calcium  7.5 (*)    Total Protein 5.7 (*)    Albumin 2.0 (*)    ALT 76 (*)    All other components within normal limits  CBC WITH DIFFERENTIAL/PLATELET - Abnormal; Notable for the following components:   WBC 2.9 (*)    RBC 3.80 (*)    Hemoglobin 10.6 (*)    HCT 32.7 (*)  Lymphs Abs 0.5 (*)    All other components within normal limits  D-DIMER, QUANTITATIVE (NOT AT [facility]) - Abnormal; Notable for the following components:   D-Dimer, Quant 3.73 (*)    All other components within normal limits  FERRITIN - Abnormal; Notable for the following components:   Ferritin 378 (*)    All other components within normal limits  URINALYSIS, ROUTINE W REFLEX MICROSCOPIC - Abnormal; Notable for the following components:   Color, Urine AMBER (*)    APPearance HAZY (*)    Ketones, ur 5 (*)    Protein, ur 30 (*)    Leukocytes,Ua MODERATE (*)    Bacteria, UA FEW (*)    All other components within normal limits  CULTURE, BLOOD (ROUTINE X 2)  CULTURE, BLOOD (ROUTINE X 2)  LACTIC ACID, PLASMA  CBC WITH DIFFERENTIAL/PLATELET  PROCALCITONIN  HIV ANTIBODY (ROUTINE TESTING W REFLEX)  PROCALCITONIN  LACTIC ACID, PLASMA  PREGNANCY, URINE  HEPATITIS PANEL, ACUTE  BRAIN NATRIURETIC PEPTIDE  MAGNESIUM  ABO/RH  TYPE AND SCREEN  TROPONIN I (HIGH SENSITIVITY)    EKG EKG Interpretation  Date/Time:  Friday Apr 18 2020 17:20:03 EDT Ventricular Rate:  92 PR Interval:    QRS Duration: 75 QT Interval:  373 QTC Calculation: 462 R Axis:   17 Text Interpretation: Sinus rhythm Abnormal R-wave progression, early transition Borderline T wave abnormalities No old tracing to compare Confirmed by Meridee Score [phone]) on 04/18/2020 5:22:57 PM   Radiology CT ANGIO CHEST PE W OR WO CONTRAST  Result Date: 04/19/2020 CLINICAL DATA:  Inpatient. Dyspnea. Intermittent cough. Desaturation. COVID-19 positive. EXAM: CT ANGIOGRAPHY CHEST WITH CONTRAST TECHNIQUE: Multidetector CT imaging of the chest was performed using the  standard protocol during bolus administration of intravenous contrast. Multiplanar CT image reconstructions and MIPs were obtained to evaluate the vascular anatomy. CONTRAST:  OMNIPAQUE IOHEXOL 350 MG/ML SOLN COMPARISON:  Chest radiograph from one day prior. FINDINGS: Cardiovascular: The study is high quality for the evaluation of pulmonary embolism. There are no filling defects in the central, lobar, segmental or subsegmental pulmonary artery branches to suggest acute pulmonary embolism. Normal course and caliber of the thoracic aorta. Top-normal caliber main pulmonary artery (3.1 cm diameter). Normal heart size. Trace pericardial effusion/thickening. Mediastinum/Nodes: No discrete thyroid nodules. Unremarkable esophagus. No pathologically enlarged axillary, mediastinal or hilar lymph nodes. Lungs/Pleura: No pneumothorax. No pleural effusion. Extensive patchy peribronchovascular and peripheral consolidation throughout both lungs involving all lung lobes, most prominent in the lower lobes with associated air bronchograms. No discrete lung masses or significant pulmonary nodules. Upper abdomen: No acute abnormality. Musculoskeletal:  No aggressive appearing focal osseous lesions. Review of the MIP images confirms the above findings. IMPRESSION: 1. No pulmonary embolism. 2. Extensive patchy consolidation throughout both lungs with associated air bronchograms, compatible with severe multilobar bronchopneumonia due to COVID-19. 3. Trace pericardial effusion/thickening. Electronically Signed   By: Delbert Phenix M.D.   On: 04/19/2020 07:00   DG Chest Port 1 View  Result Date: 04/18/2020 CLINICAL DATA:  COVID-19.  Shortness of breath.  Hypoxemia. EXAM: PORTABLE CHEST 1 VIEW COMPARISON:  None. FINDINGS: The patient has extensive bilateral pulmonary infiltrates, more severe on the right than the left. No effusions. Heart size and vascularity are normal. Bones are normal. IMPRESSION: Extensive bilateral pulmonary  infiltrates, more severe on the right than the left, consistent with COVID pneumonia. Electronically Signed   By: Francene Boyers M.D.   On: 04/18/2020 18:21    Procedures .Critical Care Performed by: Meridee Score  C, MD Authorized by: Terrilee Files, MD   Critical care provider statement:    Critical care time (minutes):  45   Critical care time was exclusive of:  Separately billable procedures and treating other patients   Critical care was necessary to treat or prevent imminent or life-threatening deterioration of the following conditions:  Respiratory failure   Critical care was time spent personally by me on the following activities:  Discussions with consultants, evaluation of patient's response to treatment, examination of patient, ordering and performing treatments and interventions, ordering and review of laboratory studies, ordering and review of radiographic studies, pulse oximetry, re-evaluation of patient's condition, obtaining history from patient or surrogate, review of old charts and development of treatment plan with patient or surrogate   I assumed direction of critical care for this patient from another provider in my specialty: no     (including critical care time)  Medications Ordered in ED Medications  remdesivir 100 mg in sodium chloride 0.9 % 100 mL IVPB (100 mg Intravenous New Bag/Given 04/19/20 0902)  ondansetron (ZOFRAN) injection 4 mg (has no administration in time range)  cefTRIAXone (ROCEPHIN) 1 g in sodium chloride 0.9 % 100 mL IVPB (has no administration in time range)  methylPREDNISolone sodium succinate (SOLU-MEDROL) 125 mg/2 mL injection 60 mg (60 mg Intravenous Given 04/19/20 0857)  enoxaparin (LOVENOX) injection 50 mg (50 mg Subcutaneous Given 04/19/20 0857)  lactated ringers infusion ( Intravenous New Bag/Given 04/19/20 0900)  dexamethasone (DECADRON) injection 10 mg (10 mg Intravenous Given 04/18/20 1706)  sodium chloride 0.9 % bolus 500 mL (0 mLs  Intravenous Stopped 04/18/20 2005)  remdesivir 100 mg in sodium chloride 0.9 % 100 mL IVPB (0 mg Intravenous Stopped 04/18/20 2006)  0.9 %  sodium chloride infusion ( Intravenous New Bag/Given 04/18/20 2030)  tocilizumab (ACTEMRA) 8 mg/kg = 564 mg in sodium chloride 0.9 % 100 mL infusion (564 mg Intravenous New Bag/Given 04/19/20 0522)  iohexol (OMNIPAQUE) 350 MG/ML injection 100 mL (100 mLs Intravenous Contrast Given 04/19/20 0640)    ED Course  I have reviewed the triage vital signs and the nursing notes.  Pertinent labs & imaging results that were available during my care of the patient were reviewed by me and considered in my medical decision making (see chart for details).  Clinical Course as of Apr 20 1315  Fri Apr 18, 2020  1717 Patient placed on 15 L nasal cannula with improvement in sats to the high 80s.  Respiratory was able to prone her and she is breathing in the mid 90s now.   [MB]  1747 Chest x-ray showing likely multifocal pneumonia.  Interpreted by me. Awaiting radiology reading.   [MB]  6712 Patient initial lactate of 2.0.  Blood pressure low on arrival although reviewing prior blood pressures I think she is normally around 105 systolic.  I think the lactate is not related to sepsis and more to her work of breathing.  Because I think this is likely due to Covid have not given her aggressive fluid boluses.  Blood pressure improved to 96/72 with 500 cc of fluid not fully in.     [MB]  1804 Decadron and remdesivir ordered.   [MB]  [id number] Dicussed  with Dr. Allena Katz [facility] hospital[facility] at [facility] who accepts the patient for admission.   [MB]    Clinical Course User Index [MB] Terrilee Files, MD   MDM Rules/Calculators/A&P  Kelly Patel was evaluated in Emergency Department on 04/18/2020 for the symptoms described in the history of present illness. She was evaluated in the context of the global COVID-19 pandemic, which necessitated consideration that the patient  might be at risk for infection with the SARS-CoV-2 virus that causes COVID-19. Institutional protocols and algorithms that pertain to the evaluation of patients at risk for COVID-19 are in a state of rapid change based on information released by regulatory bodies including the CDC and federal and state organizations. These policies and algorithms were followed during the patient's care in the ED.  This patient complains of shortness of breath in the setting of Covid positive; this involves an extensive number of treatment Options and is a complaint that carries with it a high risk of complications and Morbidity. The differential includes Covid pneumonia, PE, bacterial pneumonia, pneumothorax  I ordered, reviewed and interpreted labs, which included CBC showing a normal white count normal hemoglobin, chemistries with mildly low calcium and elevated ALT.  Covid testing positive.  D-dimer elevated. I ordered medication steroids and remdesivir, IV fluids I ordered imaging studies which included chest x-ray and I independently    visualized and interpreted imaging which showed multi lobar pneumonia Additional history obtained from patient's husband Previous records obtained and reviewed in epic I consulted Dr. Allena KatzPatel [facility] hospital[facility] and discussed lab and imaging findings  Critical Interventions: Treatment of respiratory distress in the setting of Covid with steroids, oxygen, proning  After the interventions stated above, I reevaluated the patient and found patient to be improved and her hypoxia and tachypnea.  Will need admission for continued management.  Final Clinical Impression(s) / ED Diagnoses Final diagnoses:  Pneumonia due to COVID-19 virus  Acute respiratory failure with hypoxia [facility](HCC)    Rx / DC Orders ED Discharge Orders    None       Terrilee FilesButler, Todd Argabright C, MD 04/19/20 1322

## 2020-04-18 NOTE — ED Notes (Signed)
[facility] staff given report, and RN Alisha at [facility] updated with pt current condition & treatments.

## 2020-04-18 NOTE — ED Notes (Signed)
Pt and husband updated on plan/status.

## 2020-04-18 NOTE — ED Notes (Signed)
Patient assisted to lie on her back.  O2 sat remains between 96-99%.  Encouraged to call for assistance as needed.

## 2020-04-18 NOTE — ED Notes (Signed)
[facility] notified (Kelly Patel) - patient ready for transport 

## 2020-04-18 NOTE — ED Triage Notes (Signed)
Pt c/o COVID symptoms for 16 days. Pt has had a COVID + test. Pt in obvious distress and breathing 80 x per minute. SpO2 75%. Pt taken directly to Rm 6 and EDP at bedside. Pt placed on 15L NRB then switched over to high flow N/C at 12 LPM.

## 2020-04-19 ENCOUNTER — Inpatient Hospital Stay (HOSPITAL_COMMUNITY): Payer: 59

## 2020-04-19 ENCOUNTER — Encounter (HOSPITAL_COMMUNITY): Payer: Self-pay | Admitting: Internal Medicine

## 2020-04-19 DIAGNOSIS — J069 Acute upper respiratory infection, unspecified: Secondary | ICD-10-CM | POA: Diagnosis present

## 2020-04-19 DIAGNOSIS — U071 COVID-19: Secondary | ICD-10-CM | POA: Diagnosis present

## 2020-04-19 LAB — COMPREHENSIVE METABOLIC PANEL
ALT: 76 U/L — ABNORMAL HIGH (ref 0–44)
AST: 39 U/L (ref 15–41)
Albumin: 2 g/dL — ABNORMAL LOW (ref 3.5–5.0)
Alkaline Phosphatase: 68 U/L (ref 38–126)
Anion gap: 10 (ref 5–15)
BUN: 17 mg/dL (ref 6–20)
CO2: 22 mmol/L (ref 22–32)
Calcium: 7.5 mg/dL — ABNORMAL LOW (ref 8.9–10.3)
Chloride: 106 mmol/L (ref 98–111)
Creatinine, Ser: 0.46 mg/dL (ref 0.44–1.00)
GFR calc Af Amer: >60 mL/min (ref >60)
GFR calc non Af Amer: >60 mL/min (ref >60)
Glucose, Bld: 181 mg/dL — ABNORMAL HIGH (ref 70–99)
Potassium: 4.2 mmol/L (ref 3.5–5.1)
Sodium: 138 mmol/L (ref 135–145)
Total Bilirubin: 0.8 mg/dL (ref 0.3–1.2)
Total Protein: 5.7 g/dL — ABNORMAL LOW (ref 6.5–8.1)

## 2020-04-19 LAB — C-REACTIVE PROTEIN: CRP: 8.3 mg/dL — ABNORMAL HIGH (ref <1.0)

## 2020-04-19 LAB — CBC WITH DIFFERENTIAL/PLATELET
Abs Immature Granulocytes: 0.05 10*3/uL (ref 0.00–0.07)
Basophils Absolute: 0 10*3/uL (ref 0.0–0.1)
Basophils Relative: 0 %
Eosinophils Absolute: 0 10*3/uL (ref 0.0–0.5)
Eosinophils Relative: 0 %
HCT: 32.7 % — ABNORMAL LOW (ref 36.0–46.0)
Hemoglobin: 10.6 g/dL — ABNORMAL LOW (ref 12.0–15.0)
Immature Granulocytes: 2 %
Lymphocytes Relative: 16 %
Lymphs Abs: 0.5 10*3/uL — ABNORMAL LOW (ref 0.7–4.0)
MCH: 27.9 pg (ref 26.0–34.0)
MCHC: 32.4 g/dL (ref 30.0–36.0)
MCV: 86.1 fL (ref 80.0–100.0)
Monocytes Absolute: 0.1 10*3/uL (ref 0.1–1.0)
Monocytes Relative: 3 %
Neutro Abs: 2.3 10*3/uL (ref 1.7–7.7)
Neutrophils Relative %: 79 %
Platelets: 275 10*3/uL (ref 150–400)
RBC: 3.8 MIL/uL — ABNORMAL LOW (ref 3.87–5.11)
RDW: 13.3 % (ref 11.5–15.5)
WBC: 2.9 10*3/uL — ABNORMAL LOW (ref 4.0–10.5)
nRBC: 0 % (ref 0.0–0.2)

## 2020-04-19 LAB — ABO/RH: ABO/RH(D): B NEG

## 2020-04-19 LAB — URINALYSIS, ROUTINE W REFLEX MICROSCOPIC
Bilirubin Urine: NEGATIVE
Glucose, UA: NEGATIVE mg/dL
Hgb urine dipstick: NEGATIVE
Ketones, ur: 5 mg/dL — AB
Nitrite: NEGATIVE
Protein, ur: 30 mg/dL — AB
Specific Gravity, Urine: 1.029 (ref 1.005–1.030)
pH: 5 (ref 5.0–8.0)

## 2020-04-19 LAB — HEPATITIS PANEL, ACUTE
HCV Ab: NONREACTIVE
Hep A IgM: NONREACTIVE
Hep B C IgM: NONREACTIVE
Hepatitis B Surface Ag: NONREACTIVE

## 2020-04-19 LAB — TYPE AND SCREEN
ABO/RH(D): B NEG
Antibody Screen: NEGATIVE

## 2020-04-19 LAB — BRAIN NATRIURETIC PEPTIDE: B Natriuretic Peptide: 29.6 pg/mL (ref 0.0–100.0)

## 2020-04-19 LAB — PREGNANCY, URINE: Preg Test, Ur: NEGATIVE

## 2020-04-19 LAB — TROPONIN I (HIGH SENSITIVITY): Troponin I (High Sensitivity): 3 ng/L (ref <18)

## 2020-04-19 LAB — D-DIMER, QUANTITATIVE: D-Dimer, Quant: 3.73 ug/mL-FEU — ABNORMAL HIGH (ref 0.00–0.50)

## 2020-04-19 LAB — HIV ANTIBODY (ROUTINE TESTING W REFLEX): HIV Screen 4th Generation wRfx: NONREACTIVE

## 2020-04-19 LAB — LACTIC ACID, PLASMA: Lactic Acid, Venous: 1.3 mmol/L (ref 0.5–1.9)

## 2020-04-19 LAB — FERRITIN: Ferritin: 378 ng/mL — ABNORMAL HIGH (ref 11–307)

## 2020-04-19 LAB — MAGNESIUM: Magnesium: 2.1 mg/dL (ref 1.7–2.4)

## 2020-04-19 LAB — PROCALCITONIN: Procalcitonin: <0.1 ng/mL

## 2020-04-19 MED ORDER — TOCILIZUMAB 400 MG/20ML IV SOLN
8.0000 mg/kg | Freq: Once | INTRAVENOUS | Status: AC
  Administered 2020-04-19: 564 mg via INTRAVENOUS
  Filled 2020-04-19: qty 28.2

## 2020-04-19 MED ORDER — ONDANSETRON HCL 4 MG/2ML IJ SOLN: 4.0000 mg | Freq: Four times a day (QID) | INTRAMUSCULAR | Status: DC | PRN

## 2020-04-19 MED ORDER — ENOXAPARIN SODIUM 60 MG/0.6ML ~~LOC~~ SOLN
50.0000 mg | Freq: Two times a day (BID) | SUBCUTANEOUS | Status: DC
  Administered 2020-04-19 – 2020-04-24 (×11): 50 mg via SUBCUTANEOUS
  Filled 2020-04-19 (×11): qty 0.6

## 2020-04-19 MED ORDER — METHYLPREDNISOLONE SODIUM SUCC 125 MG IJ SOLR
60.0000 mg | Freq: Two times a day (BID) | INTRAMUSCULAR | Status: DC
  Administered 2020-04-19 – 2020-04-20 (×4): 60 mg via INTRAVENOUS
  Filled 2020-04-19 (×4): qty 2

## 2020-04-19 MED ORDER — LACTATED RINGERS IV SOLN: INTRAVENOUS | Status: AC

## 2020-04-19 MED ORDER — GUAIFENESIN-DM 100-10 MG/5ML PO SYRP
10.0000 mL | ORAL_SOLUTION | Freq: Four times a day (QID) | ORAL | Status: DC | PRN
  Administered 2020-04-19: 10 mL via ORAL
  Filled 2020-04-19: qty 10

## 2020-04-19 MED ORDER — ONDANSETRON HCL 4 MG PO TABS: 4.0000 mg | ORAL_TABLET | Freq: Four times a day (QID) | ORAL | Status: DC | PRN

## 2020-04-19 MED ORDER — ENOXAPARIN SODIUM 40 MG/0.4ML ~~LOC~~ SOLN
40.0000 mg | SUBCUTANEOUS | Status: DC
  Administered 2020-04-19: 40 mg via SUBCUTANEOUS
  Filled 2020-04-19: qty 0.4

## 2020-04-19 MED ORDER — IOHEXOL 350 MG/ML SOLN
100.0000 mL | Freq: Once | INTRAVENOUS | Status: AC | PRN
  Administered 2020-04-19: 100 mL via INTRAVENOUS

## 2020-04-19 MED ORDER — DEXAMETHASONE SODIUM PHOSPHATE 10 MG/ML IJ SOLN: 6.0000 mg | INTRAMUSCULAR | Status: DC

## 2020-04-19 MED ORDER — SODIUM CHLORIDE 0.9 % IV SOLN
1.0000 g | Freq: Every day | INTRAVENOUS | Status: DC
  Administered 2020-04-20: 1 g via INTRAVENOUS
  Filled 2020-04-19: qty 10

## 2020-04-19 NOTE — Progress Notes (Signed)
PROGRESS NOTE                                                                                                                                                                                                             Patient Demographics:    Kelly Patel, is a 34 y.o. female, DOB - 1986/02/11, JKD:[id number]  Outpatient Primary MD for the patient is Patient, No Pcp Per    LOS - 1  Admit date - 04/18/2020    Chief Complaint  Patient presents with  . COVID Positive       Brief Narrative -  Kelly Patel is a 34 y.o. female with no significant past medical history presents to the ER at [facility] with worsening shortness of breath.  Patient states she was diagnosed with COVID-19 infection about 2 weeks ago when patient had headache fever chills, she has not been eating or drinking well for 2 weeks, for the last 4 days she started to get more and more short of breath.  In the ER diagnosed with severe acute hypoxic respiratory failure due to COVID-19 pneumonia along with severe dehydration and hypotension admitted to the hospital.   Subjective:    Kelly Patel today has, No headache, No chest pain, No abdominal pain - No Nausea, No new weakness tingling or numbness, positive mild dry cough but severe shortness of breath.   Assessment  & Plan :     1. Sepsis with severe Acute Hypoxic Resp. Failure due to Acute Covid 19 Viral Pneumonitis during the ongoing 2020 Covid 19 Pandemic - she has severe disease with extensive parenchymal lung disease ongoing for several days to a few weeks prior to admission.  She has been appropriately started on IV steroids, remdesivir and has received Actemra after consent in the ER.  Have increased her IV steroid dose, she is extremely tenuous and will be closely monitored.  If needed she will be transition to heated high flow.  Encouraged the patient to sit up in chair in the daytime use  I-S and flutter valve for pulmonary toiletry and then prone in bed when at night.  Will advance activity and titrate down oxygen as possible.   SpO2: 95 % O2 Flow Rate (L/min): 10 L/min  Recent Labs  Lab 04/18/20 1655 04/18/20 1656 04/19/20 0627  CRP 11.8*  --  8.3*  DDIMER 4.63*  --  3.73*  PROCALCITON <0.10  --  <0.10  SARSCOV2NAA  --  POSITIVE*  --     Hepatic Function Latest Ref Rng & Units 04/19/2020 04/18/2020  Total Protein 6.5 - 8.1 g/dL 5.7(L) 7.4  Albumin 3.5 - 5.0 g/dL 2.0(L) 3.0(L)  AST 15 - 41 U/L 39 35  ALT 0 - 44 U/L 76(H) 89(H)  Alk Phosphatase 38 - 126 U/L 68 87  Total Bilirubin 0.3 - 1.2 mg/dL 0.8 1.2    2.  Mild asymptomatic COVID-19 viral infection related transaminitis.  Trend.  3.  Severe dehydration with hypotension.  LR bolus followed by maintenance.   4.  Elevated D-dimer due to severe inflammation from COVID-19.  On high-dose Lovenox, if continues to trend high will check leg ultrasound and place her prophylactically on full dose for a few days, CTA no PE.    Condition - Extremely Guarded  Family Communication  : Left message for husband [phone] on 04/19/2020 at 8:53 AM  Code Status :  Full  Consults  :  None  Procedures  :    CTA -no PE, bilateral interstitial infiltrates.  PUD Prophylaxis : None  Disposition Plan  :    Status is: Inpatient  Remains inpatient appropriate because:IV treatments appropriate due to intensity of illness or inability to take PO   Dispo: The patient is from: Home              Anticipated d/c is to: Home              Anticipated d/c date is: > 3 days              Patient currently is not medically stable to d/c.  Severe acute hypoxic respiratory failure due to COVID-19 pneumonia, on multiple liters of oxygen getting IV remdesivir and steroid treatment.   DVT Prophylaxis  :  Lovenox   Lab Results  Component Value Date   PLT 275 04/19/2020    Diet :  Diet Order            Diet regular Room  service appropriate? Yes; Fluid consistency: Thin  Diet effective now               Inpatient Medications  Scheduled Meds: . enoxaparin (LOVENOX) injection  50 mg Subcutaneous Q12H  . methylPREDNISolone (SOLU-MEDROL) injection  60 mg Intravenous Q12H   Continuous Infusions: . cefTRIAXone (ROCEPHIN)  IV    . lactated ringers    . remdesivir 100 mg in NS 100 mL     PRN Meds:.[DISCONTINUED] ondansetron **OR** ondansetron (ZOFRAN) IV  Antibiotics  :    Anti-infectives (From admission, onward)   Start     Dose/Rate Route Frequency Ordered Stop   04/19/20 1000  remdesivir 100 mg in sodium chloride 0.9 % 100 mL IVPB     100 mg 200 mL/hr over 30 Minutes Intravenous Daily 04/18/20 1754 04/23/20 0959   04/19/20 0615  cefTRIAXone (ROCEPHIN) 1 g in sodium chloride 0.9 % 100 mL IVPB     1 g 200 mL/hr over 30 Minutes Intravenous Daily 04/19/20 0610     04/18/20 1800  remdesivir 100 mg in sodium chloride 0.9 % 100 mL IVPB     100 mg 200 mL/hr over 30 Minutes Intravenous Every 30 min 04/18/20 1754 04/18/20 2006       Time Spent in minutes  30   Lala Lund M.D on 04/19/2020 at 8:53 AM  To page  go to [email or web address] - password [email or web address]  [facility] -  Office  [phone]    See all Orders from today for further details    Objective:   Vitals:   04/19/20 0320 04/19/20 0325 04/19/20 0434 04/19/20 0818  BP: 94/65   (!) 82/50  Pulse: 85 61  70  Resp: (!) 37 (!) 25  20  Temp: 98.1 F (36.7 C)  97.9 F (36.6 C) 97.8 F (36.6 C)  TempSrc: Oral  Oral Oral  SpO2: (!) 87% (!) 88%  95%  Weight:      Height:        Wt Readings from Last 3 Encounters:  04/18/20 70.5 kg  08/24/16 68.9 kg     Intake/Output Summary (Last 24 hours) at 04/19/2020 0853 Last data filed at 04/19/2020 0430 Gross per 24 hour  Intake 600 ml  Output 200 ml  Net 400 ml     Physical Exam  Awake Alert, No new F.N deficits, Normal affect [place].AT,PERRAL Supple Neck,No JVD, No cervical  lymphadenopathy appriciated.  Symmetrical Chest wall movement, Good air movement bilaterally, CTAB RRR,No Gallops,Rubs or new Murmurs, No Parasternal Heave +ve B.Sounds, Abd Soft, No tenderness, No organomegaly appriciated, No rebound - guarding or rigidity. No Cyanosis, Clubbing or edema, No new Rash or bruise       Data Review:    CBC Recent Labs  Lab 04/18/20 1655 04/19/20 0627  WBC 5.2 2.9*  HGB 13.7 10.6*  HCT 41.4 32.7*  PLT 323 275  MCV 84.1 86.1  MCH 27.8 27.9  MCHC 33.1 32.4  RDW 13.6 13.3  LYMPHSABS 1.0 0.5*  MONOABS 0.3 0.1  EOSABS 0.1 0.0  BASOSABS 0.0 0.0    Chemistries  Recent Labs  Lab 04/18/20 1655 04/19/20 0627  NA 138 138  K 3.8 4.2  CL 100 106  CO2 25 22  GLUCOSE 118* 181*  BUN 17 17  CREATININE 0.55 0.46  CALCIUM 8.3* 7.5*  AST 35 39  ALT 89* 76*  ALKPHOS 87 68  BILITOT 1.2 0.8     ------------------------------------------------------------------------------------------------------------------ Recent Labs    04/18/20 1655  TRIG 253*    No results found for: HGBA1C ------------------------------------------------------------------------------------------------------------------ No results for input(s): TSH, T4TOTAL, T3FREE, THYROIDAB in the last 72 hours.  Invalid input(s): FREET3  Cardiac Enzymes No results for input(s): CKMB, TROPONINI, MYOGLOBIN in the last 168 hours.  Invalid input(s): CK ------------------------------------------------------------------------------------------------------------------ No results found for: BNP  Micro Results Recent Results (from the past 240 hour(s))  SARS Coronavirus 2 by RT PCR (hospital order, performed in [facility] hospital lab) Nasopharyngeal Nasopharyngeal Swab     Status: Abnormal   Collection Time: 04/18/20  4:56 PM   Specimen: Nasopharyngeal Swab  Result Value Ref Range Status   SARS Coronavirus 2 POSITIVE (A) NEGATIVE Final    Comment: RESULT CALLED TO, READ BACK BY AND  VERIFIED WITH: CALLED TO S.COBLE AT 1838 ON [id number] BY SROY (NOTE) SARS-CoV-2 target nucleic acids are DETECTED SARS-CoV-2 RNA is generally detectable in upper respiratory specimens  during the acute phase of infection.  Positive results are indicative  of the presence of the identified virus, but do not rule out bacterial infection or co-infection with other pathogens not detected by the test.  Clinical correlation with patient history and  other diagnostic information is necessary to determine patient infection status.  The expected result is negative. Fact Sheet for Patients:   StrictlyIdeas.no  Fact Sheet for Healthcare Providers:   BankingDealers.co.za   This test is  not yet approved or cleared by the [place] and  has been authorized for detection and/or diagnosis of SARS-CoV-2 by FDA under an Emergency Use Authorization (EUA).  This EUA will remain in effect (meaning this t est can be used) for the duration of  the COVID-19 declaration under Section 564(b)(1) of the Act, 21 U.S.C. section 360-bbb-3(b)(1), unless the authorization is terminated or revoked sooner. Performed at [facility], [address] [place], [place] [address]   Blood Culture (routine x 2)     Status: None (Preliminary result)   Collection Time: 04/18/20  5:00 PM   Specimen: BLOOD RIGHT ARM  Result Value Ref Range Status   Specimen Description   Final    BLOOD RIGHT ARM Performed at [facility], [address] [place], [place] [address]    Special Requests   Final    BOTTLES DRAWN AEROBIC AND ANAEROBIC Blood Culture adequate volume Performed at [facility], [address] [place], [place] [address]    Culture   Final    NO GROWTH < 12 HOURS Performed at [facility], [place] [address], [place], [place] [address]    Report Status PENDING  Incomplete  Blood Culture (routine x 2)     Status: None  (Preliminary result)   Collection Time: 04/18/20  5:10 PM   Specimen: Left Antecubital; Blood  Result Value Ref Range Status   Specimen Description   Final    LEFT ANTECUBITAL Performed at [facility], [address] [place], [place] [address]    Special Requests   Final    BOTTLES DRAWN AEROBIC AND ANAEROBIC Blood Culture adequate volume Performed at [facility], [address] [place], [place] [address]    Culture   Final    NO GROWTH < 12 HOURS Performed at [facility], [place] [address], [place],  [address]    Report Status PENDING  Incomplete    Radiology Reports CT ANGIO CHEST PE W OR WO CONTRAST  Result Date: 04/19/2020 CLINICAL DATA:  Inpatient. Dyspnea. Intermittent cough. Desaturation. COVID-19 positive. EXAM: CT ANGIOGRAPHY CHEST WITH CONTRAST TECHNIQUE: Multidetector CT imaging of the chest was performed using the standard protocol during bolus administration of intravenous contrast. Multiplanar CT image reconstructions and MIPs were obtained to evaluate the vascular anatomy. CONTRAST:  136m OMNIPAQUE IOHEXOL 350 MG/ML SOLN COMPARISON:  Chest radiograph from one day prior. FINDINGS: Cardiovascular: The study is high quality for the evaluation of pulmonary embolism. There are no filling defects in the central, lobar, segmental or subsegmental pulmonary artery branches to suggest acute pulmonary embolism. Normal course and caliber of the thoracic aorta. Top-normal caliber main pulmonary artery (3.1 cm diameter). Normal heart size. Trace pericardial effusion/thickening. Mediastinum/Nodes: No discrete thyroid nodules. Unremarkable esophagus. No pathologically enlarged axillary, mediastinal or hilar lymph nodes. Lungs/Pleura: No pneumothorax. No pleural effusion. Extensive patchy peribronchovascular and peripheral consolidation throughout both lungs involving all lung lobes, most prominent in the lower lobes with associated air bronchograms. No  discrete lung masses or significant pulmonary nodules. Upper abdomen: No acute abnormality. Musculoskeletal:  No aggressive appearing focal osseous lesions. Review of the MIP images confirms the above findings. IMPRESSION: 1. No pulmonary embolism. 2. Extensive patchy consolidation throughout both lungs with associated air bronchograms, compatible with severe multilobar bronchopneumonia due to COVID-19. 3. Trace pericardial effusion/thickening. Electronically Signed   By: JIlona SorrelM.D.   On: 04/19/2020 07:00  DG Chest Port 1 View  Result Date: 04/18/2020 CLINICAL DATA:  COVID-19.  Shortness of breath.  Hypoxemia. EXAM: PORTABLE CHEST 1 VIEW COMPARISON:  None. FINDINGS: The patient has extensive bilateral pulmonary infiltrates, more severe on the right than the left. No effusions. Heart size and vascularity are normal. Bones are normal. IMPRESSION: Extensive bilateral pulmonary infiltrates, more severe on the right than the left, consistent with COVID pneumonia. Electronically Signed   By: Lorriane Shire M.D.   On: 04/18/2020 18:21

## 2020-04-19 NOTE — Progress Notes (Signed)
Spoke with patient's husband via phone. Updated on plan of care and patient status. All questions answered at this time. Facetime set up for 3 pm

## 2020-04-19 NOTE — Plan of Care (Signed)
Educate patient on COVID. Treat patient with COVID. Teach respiratory interventions.

## 2020-04-19 NOTE — Progress Notes (Signed)
Pt ambulated to bathroom with standby/1 assist on 10L O2 Nasal Cannula. During ambulation she c/o dizziness/weakness. I witnessed her swaying at times, labored breathing with intermittent cough. O2 sats started at 90% and lowest desat was 74%. She stayed in the 84-88% range while voiding. After independently using the restroom, she washed her hands and ambulated to the bed where shortly after her O2 sats were back at 90% on 10L [place]. Sent off urine. Pt resting. Awaiting results of stat urine pregnancy test before hanging Actemra. Will continue to monitor.

## 2020-04-19 NOTE — Progress Notes (Signed)
Kelly Patel [id number] Admission Data: 04/19/2020 1:40 AM Attending Provider: Eduard Clos, MD  HEN:IDPOEUM, No Pcp Per Consults/ Treatment Team:   Kelly Patel is a 34 y.o. female patient admitted from ED awake, alert  & orientated  X 3,  No Order, VSS - ,98.0 Oral Temp, 73 Pulse, 29 Respirations, 103/69 BP, 90% 10 liters O2 nasal cannular, shortness of breath, dyspnea with any exertion,  no c/o chest pain, no distress noted. Tele # [id number] placed and pt is currently running: NSR   IV site WDL: LAC and Right Upper Forearm   Allergies:  No Known Allergies   Past Medical History:  Diagnosis Date  . Medical history non-contributory     History:  obtained from  Tobacco/alcohol:  Pt orientation to unit, room and routine. Information packet given to patient and safety video watched.  Admission INP armband ID verified with patient/family, and in place. SR up x 2, fall risk assessment complete with Patient and family verbalizing understanding of risks associated with falls. Pt verbalizes an understanding of how to use the call bell and to call for help before getting out of bed.  Skin, clean-dry- intact without evidence of bruising, or skin tears.   No evidence of skin break down noted on exam. Language spoken: URDU  Attending has been paged notifying of her arrival to floor.  Will cont to monitor and assist as needed.  Tacey Heap, RN 04/19/2020 1:40 AM

## 2020-04-19 NOTE — Progress Notes (Signed)
Upon arrival to floor patients MEWS is at a yellow. This is due to her high respiratory rate and soft blood pressure. She was a red [facility] at [facility] for same issues deeming this a known score. She was given a bolus and started on continuous IV NS which she slightly responded to. HR initially was around 80 resp per minute! Now she fluctuates mid teens to 30s.   I did speak with the charge nurse and it was agreed that this did not need to be escalated. Will carry out MEWS Yellow Protocol and monitor patient for any acute changes.

## 2020-04-19 NOTE — Plan of Care (Signed)
Increase activity as tolerated. Decrease risk of infection

## 2020-04-19 NOTE — H&P (Signed)
History and Physical    Dymin Dingledine FUX:[id number] DOB: 05-30-1986 DOA: 04/18/2020  PCP: Patient, No Pcp Per  Patient coming from: Home.  Chief Complaint: Shortness of breath.  HPI: Semiah Konczal is a 34 y.o. female with no significant past medical history presents to the ER at [facility] with worsening shortness of breath.  Patient states she was diagnosed with COVID-19 infection about 2 weeks ago when patient had headache fever chills.  At the time most of the symptoms were upper respiratory tract symptoms.  Over the last 4 days patient started developing shortness of breath and productive cough.  No chest pain has had some diarrhea and vomiting.  ED Course: In the ER patient was hypoxic requiring almost nonrebreather initially then weaned off to about 15 L oxygen.  Chest x-ray shows bilateral infiltrates Covid test was positive patient was afebrile.  Labs show CRP of 11.8 procalcitonin negative lactic acid was 2 improved with fluids to 1.4 EKG shows normal sinus rhythm with T wave inversions D-dimer was 4.63.  Patient was started on remdesivir Decadron admitted for acute respiratory failure with hypoxia secondary to Covid infection.  Review of Systems: As per HPI, rest all negative.   Past Medical History:  Diagnosis Date  . Medical history non-contributory     Past Surgical History:  Procedure Laterality Date  . CESAREAN SECTION       reports that she has never smoked. She has never used smokeless tobacco. She reports that she does not drink alcohol or use drugs.  No Known Allergies  Family History  Problem Relation Age of Onset  . Diabetes Mellitus II Father     Prior to Admission medications   Medication Sig Start Date End Date Taking? Authorizing Provider  cephALEXin (KEFLEX) 500 MG capsule Take 1 capsule (500 mg total) by mouth 4 (four) times daily. 08/24/16   Jorje Guild, NP  OVER THE COUNTER MEDICATION Take 1 tablet by mouth daily as needed (nausea).  Nausea medication    [provider]  promethazine (PHENERGAN) 12.5 MG tablet Take 1 tablet (12.5 mg total) by mouth every 6 (six) hours as needed for nausea or vomiting. 08/24/16   Jorje Guild, NP  terconazole (TERAZOL 3) 0.8 % vaginal cream Place 1 applicator vaginally at bedtime. 08/24/16   Jorje Guild, NP    Physical Exam: Constitutional: Moderately built and nourished. Vitals:   04/18/20 2130 04/18/20 2135 04/18/20 2315 04/19/20 0115  BP: (!) 88/57 (!) 93/58 103/69 91/61  Pulse: (!) 59 71 73 (!) 59  Resp: (!) 27 (!) 36 (!) 29 (!) 22  Temp:   98 F (36.7 C) 97.7 F (36.5 C)  TempSrc:   Oral Oral  SpO2: 100% 97% 90% 90%  Weight:   70.5 kg   Height:   5' (1.524 m)    Eyes: Anicteric no pallor. ENMT: No discharge from the ears eyes nose or mouth. Neck: No mass felt.  No neck rigidity. Respiratory: No rhonchi or crepitations. Cardiovascular: S1-S2 heard. Abdomen: Soft nontender bowel sounds present. Musculoskeletal: No edema. Skin: No rash. Neurologic: Alert awake oriented time place and person.  Moves all extremities. Psychiatric: Appears normal.  Normal affect.   Labs on Admission: I have personally reviewed following labs and imaging studies  CBC: Recent Labs  Lab 04/18/20 1655  WBC 5.2  NEUTROABS 3.7  HGB 13.7  HCT 41.4  MCV 84.1  PLT 025   Basic Metabolic Panel: Recent Labs  Lab 04/18/20 1655  NA 138  K 3.8  CL 100  CO2 25  GLUCOSE 118*  BUN 17  CREATININE 0.55  CALCIUM 8.3*   GFR: Estimated Creatinine Clearance: 85.2 mL/min (by C-G formula based on SCr of 0.55 mg/dL). Liver Function Tests: Recent Labs  Lab 04/18/20 1655  AST 35  ALT 89*  ALKPHOS 87  BILITOT 1.2  PROT 7.4  ALBUMIN 3.0*   No results for input(s): LIPASE, AMYLASE in the last 168 hours. No results for input(s): AMMONIA in the last 168 hours. Coagulation Profile: No results for input(s): INR, PROTIME in the last 168 hours. Cardiac Enzymes: No results for  input(s): CKTOTAL, CKMB, CKMBINDEX, TROPONINI in the last 168 hours. BNP (last 3 results) No results for input(s): PROBNP in the last 8760 hours. HbA1C: No results for input(s): HGBA1C in the last 72 hours. CBG: No results for input(s): GLUCAP in the last 168 hours. Lipid Profile: Recent Labs    04/18/20 1655  TRIG 253*   Thyroid Function Tests: No results for input(s): TSH, T4TOTAL, FREET4, T3FREE, THYROIDAB in the last 72 hours. Anemia Panel: Recent Labs    04/18/20 1655  FERRITIN 457*   Urine analysis:    Component Value Date/Time   COLORURINE YELLOW 08/24/2016 1331   APPEARANCEUR CLEAR 08/24/2016 1331   LABSPEC 1.010 08/24/2016 1331   PHURINE 6.5 08/24/2016 1331   GLUCOSEU NEGATIVE 08/24/2016 1331   HGBUR SMALL (A) 08/24/2016 1331   BILIRUBINUR NEGATIVE 08/24/2016 1331   KETONESUR NEGATIVE 08/24/2016 1331   PROTEINUR NEGATIVE 08/24/2016 1331   NITRITE POSITIVE (A) 08/24/2016 1331   LEUKOCYTESUR SMALL (A) 08/24/2016 1331   Sepsis Labs: @LABRCNTIP (procalcitonin:4,lacticidven:4) ) Recent Results (from the past 240 hour(s))  SARS Coronavirus 2 by RT PCR (hospital order, performed in [facility] hospital lab) Nasopharyngeal Nasopharyngeal Swab     Status: Abnormal   Collection Time: 04/18/20  4:56 PM   Specimen: Nasopharyngeal Swab  Result Value Ref Range Status   SARS Coronavirus 2 POSITIVE (A) NEGATIVE Final    Comment: RESULT CALLED TO, READ BACK BY AND VERIFIED WITH: CALLED TO S.COBLE AT 1838 ON 04/20/20 BY SROY (NOTE) SARS-CoV-2 target nucleic acids are DETECTED SARS-CoV-2 RNA is generally detectable in upper respiratory specimens  during the acute phase of infection.  Positive results are indicative  of the presence of the identified virus, but do not rule out bacterial infection or co-infection with other pathogens not detected by the test.  Clinical correlation with patient history and  other diagnostic information is necessary to determine  patient infection status.  The expected result is negative. Fact Sheet for Patients:   176160  Fact Sheet for Healthcare Providers:   BoilerBrush.com.cy   This test is not yet approved or cleared by the https://pope.com/ FDA and  has been authorized for detection and/or diagnosis of SARS-CoV-2 by FDA under an Emergency Use Authorization (EUA).  This EUA will remain in effect (meaning this t est can be used) for the duration of  the COVID-19 declaration under Section 564(b)(1) of the Act, 21 U.S.C. section 360-bbb-3(b)(1), unless the authorization is terminated or revoked sooner. Performed at [facility], [address] [place], [place]      Radiological Exams on Admission: DG Chest [facility] 1 View  Result Date: 04/18/2020 CLINICAL DATA:  COVID-19.  Shortness of breath.  Hypoxemia. EXAM: PORTABLE CHEST 1 VIEW COMPARISON:  None. FINDINGS: The patient has extensive bilateral pulmonary infiltrates, more severe on the right than the left. No effusions.  Heart size and vascularity are normal. Bones are normal. IMPRESSION: Extensive bilateral pulmonary infiltrates, more severe on the right than the left, consistent with COVID pneumonia. Electronically Signed   By: Francene Boyers M.D.   On: 04/18/2020 18:21    EKG: Independently reviewed.  Normal sinus OT evaluation.  Assessment/Plan Principal Problem:   Acute hypoxemic respiratory failure due to COVID-19 [facility]) Active Problems:   Acute respiratory disease due to COVID-19 virus    1. Acute respiratory failure with hypoxemia secondary Covid infection for which patient has been started on Decadron and remdesivir.  I have discussed with patient about Actemra and its off label use and its contraindications and side effects.  Patient agrees to get Actemra.  Since patient is also having elevated D-dimer I have ordered CT angiogram of the chest to rule out PE.  Patient blood  pressure is in the low normal we will repeat lactic acid.  Closely monitor in stepdown.  Given that patient has acute respiratory failure with hypoxia and Covid infection will need close monitoring for any deterioration in inpatient status.  Pregnancy screen is pending.  Patient states she is not pregnant.   DVT prophylaxis: Lovenox. Code Status: Full code. Family Communication: Discussed with patient. Disposition Plan: Home. Consults called: None. Admission status: Inpatient.   Eduard Clos MD [facility] Pager [phone].  If 7PM-7AM, please contact night-coverage [email or web address] Password [email or web address]  04/19/2020, 3:06 AM

## 2020-04-20 ENCOUNTER — Inpatient Hospital Stay (HOSPITAL_COMMUNITY): Payer: 59

## 2020-04-20 LAB — CBC WITH DIFFERENTIAL/PLATELET
Abs Immature Granulocytes: 0.08 10*3/uL — ABNORMAL HIGH (ref 0.00–0.07)
Basophils Absolute: 0 10*3/uL (ref 0.0–0.1)
Basophils Relative: 0 %
Eosinophils Absolute: 0 10*3/uL (ref 0.0–0.5)
Eosinophils Relative: 0 %
HCT: 38.3 % (ref 36.0–46.0)
Hemoglobin: 12.5 g/dL (ref 12.0–15.0)
Immature Granulocytes: 1 %
Lymphocytes Relative: 9 %
Lymphs Abs: 0.8 10*3/uL (ref 0.7–4.0)
MCH: 28.1 pg (ref 26.0–34.0)
MCHC: 32.6 g/dL (ref 30.0–36.0)
MCV: 86.1 fL (ref 80.0–100.0)
Monocytes Absolute: 0.3 10*3/uL (ref 0.1–1.0)
Monocytes Relative: 4 %
Neutro Abs: 7.4 10*3/uL (ref 1.7–7.7)
Neutrophils Relative %: 86 %
Platelets: 412 10*3/uL — ABNORMAL HIGH (ref 150–400)
RBC: 4.45 MIL/uL (ref 3.87–5.11)
RDW: 13.3 % (ref 11.5–15.5)
WBC: 8.6 10*3/uL (ref 4.0–10.5)
nRBC: 0 % (ref 0.0–0.2)

## 2020-04-20 LAB — COMPREHENSIVE METABOLIC PANEL
ALT: 70 U/L — ABNORMAL HIGH (ref 0–44)
AST: 31 U/L (ref 15–41)
Albumin: 2.4 g/dL — ABNORMAL LOW (ref 3.5–5.0)
Alkaline Phosphatase: 70 U/L (ref 38–126)
Anion gap: 14 (ref 5–15)
BUN: 14 mg/dL (ref 6–20)
CO2: 20 mmol/L — ABNORMAL LOW (ref 22–32)
Calcium: 8.5 mg/dL — ABNORMAL LOW (ref 8.9–10.3)
Chloride: 103 mmol/L (ref 98–111)
Creatinine, Ser: 0.52 mg/dL (ref 0.44–1.00)
GFR calc Af Amer: >60 mL/min (ref >60)
GFR calc non Af Amer: >60 mL/min (ref >60)
Glucose, Bld: 185 mg/dL — ABNORMAL HIGH (ref 70–99)
Potassium: 4.7 mmol/L (ref 3.5–5.1)
Sodium: 137 mmol/L (ref 135–145)
Total Bilirubin: 1.3 mg/dL — ABNORMAL HIGH (ref 0.3–1.2)
Total Protein: 6.2 g/dL — ABNORMAL LOW (ref 6.5–8.1)

## 2020-04-20 LAB — BRAIN NATRIURETIC PEPTIDE: B Natriuretic Peptide: 325 pg/mL — ABNORMAL HIGH (ref 0.0–100.0)

## 2020-04-20 LAB — C-REACTIVE PROTEIN: CRP: 2.9 mg/dL — ABNORMAL HIGH (ref <1.0)

## 2020-04-20 LAB — D-DIMER, QUANTITATIVE: D-Dimer, Quant: 3.51 ug/mL-FEU — ABNORMAL HIGH (ref 0.00–0.50)

## 2020-04-20 LAB — PROCALCITONIN: Procalcitonin: <0.1 ng/mL

## 2020-04-20 LAB — MAGNESIUM: Magnesium: 2.3 mg/dL (ref 1.7–2.4)

## 2020-04-20 MED ORDER — CHLORHEXIDINE GLUCONATE CLOTH 2 % EX PADS
6.0000 | MEDICATED_PAD | Freq: Every day | CUTANEOUS | Status: DC
  Administered 2020-04-20 – 2020-04-21 (×2): 6 via TOPICAL

## 2020-04-20 NOTE — Progress Notes (Signed)
PROGRESS NOTE                                                                                                                                                                                                             Patient Demographics:    Kelly Patel, is a 34 y.o. female, DOB - 1986-08-07, AXK:[id number]  Outpatient Primary MD for the patient is Patient, No Pcp Per    LOS - 2  Admit date - 04/18/2020    Chief Complaint  Patient presents with  . COVID Positive       Brief Narrative -  Kelly Patel is a 34 y.o. female with no significant past medical history presents to the ER at [facility] with worsening shortness of breath.  Patient states she was diagnosed with COVID-19 infection about 2 weeks ago when patient had headache fever chills, she has not been eating or drinking well for 2 weeks, for the last 4 days she started to get more and more short of breath.  In the ER diagnosed with severe acute hypoxic respiratory failure due to COVID-19 pneumonia along with severe dehydration and hypotension admitted to the hospital.   Subjective:   Patient in bed, appears comfortable, denies any headache, no fever, no chest pain or pressure, +ve shortness of breath , no abdominal pain. No focal weakness.   Assessment  & Plan :     1. Sepsis with severe Acute Hypoxic Resp. Failure due to Acute Covid 19 Viral Pneumonitis during the ongoing 2020 Covid 19 Pandemic - she has severe disease with extensive parenchymal lung disease ongoing for several days to a few weeks prior to admission.  She has been appropriately started on IV steroids, remdesivir and has received Actemra after consent in the ER.  Have increased her IV steroid dose, she is extremely tenuous and will be closely monitored.  If needed she will be transition to heated high flow.  Note this patient has severe COVID-19 related parenchymal lung injury, she is  requiring 12 to 15 L of high flow oxygen, she will desaturate with minimal activity and may take up to 20-30 minutes to recover, if she moves around her supplemental oxygen needs to be increased temporarily, may use nonrebreather mask in addition to high flow or switch her to heated high flow if needed.  Encouraged  the patient to sit up in chair in the daytime use I-S and flutter valve for pulmonary toiletry and then prone in bed when at night.  Will advance activity and titrate down oxygen as possible.   SpO2: 91 % O2 Flow Rate (L/min): 15 L/min  Recent Labs  Lab 04/18/20 1655 04/18/20 1656 04/19/20 0627 04/20/20 0833  CRP 11.8*  --  8.3* 2.9*  DDIMER 4.63*  --  3.73* 3.51*  BNP  --   --  29.6  --   PROCALCITON <0.10  --  <0.10  --   SARSCOV2NAA  --  POSITIVE*  --   --     Hepatic Function Latest Ref Rng & Units 04/20/2020 04/19/2020 04/18/2020  Total Protein 6.5 - 8.1 g/dL 6.2(L) 5.7(L) 7.4  Albumin 3.5 - 5.0 g/dL 2.4(L) 2.0(L) 3.0(L)  AST 15 - 41 U/L 31 39 35  ALT 0 - 44 U/L 70(H) 76(H) 89(H)  Alk Phosphatase 38 - 126 U/L 70 68 87  Total Bilirubin 0.3 - 1.2 mg/dL 1.3(H) 0.8 1.2    2.  Mild asymptomatic COVID-19 viral infection related transaminitis.  Trend.  3.  Severe dehydration with hypotension.  LR bolus followed by maintenance.   4.  Elevated D-dimer due to severe inflammation from COVID-19.  On high-dose Lovenox, if continues to trend high will check leg ultrasound and place her prophylactically on full dose for a few days, CTA no PE.    Condition - Extremely Guarded  Family Communication  : Left message for husband [phone] on 04/19/2020 at 8:53 AM, 04/20/2020 at 10:59 AM message left again  Code Status :  Full  Consults  :  None  Procedures  :    CTA - no PE, bilateral interstitial infiltrates.  PUD Prophylaxis : None  Disposition Plan  :    Status is: Inpatient  Remains inpatient appropriate because:IV treatments appropriate due to intensity of  illness or inability to take PO   Dispo: The patient is from: Home              Anticipated d/c is to: Home              Anticipated d/c date is: > 3 days              Patient currently is not medically stable to d/c.  Severe acute hypoxic respiratory failure due to COVID-19 pneumonia, on multiple liters of oxygen getting IV remdesivir and steroid treatment.   DVT Prophylaxis  :  Lovenox   Lab Results  Component Value Date   PLT 412 (H) 04/20/2020    Diet :  Diet Order            Diet regular Room service appropriate? Yes; Fluid consistency: Thin  Diet effective now               Inpatient Medications  Scheduled Meds: . Chlorhexidine Gluconate Cloth  6 each Topical Daily  . enoxaparin (LOVENOX) injection  50 mg Subcutaneous Q12H  . methylPREDNISolone (SOLU-MEDROL) injection  60 mg Intravenous Q12H   Continuous Infusions: . remdesivir 100 mg in NS 100 mL 100 mg (04/20/20 0921)   PRN Meds:.guaiFENesin-dextromethorphan, [DISCONTINUED] ondansetron **OR** ondansetron (ZOFRAN) IV  Antibiotics  :    Anti-infectives (From admission, onward)   Start     Dose/Rate Route Frequency Ordered Stop   04/19/20 1000  remdesivir 100 mg in sodium chloride 0.9 % 100 mL IVPB     100 mg 200 mL/hr over 30  Minutes Intravenous Daily 04/18/20 1754 04/23/20 0959   04/19/20 0615  cefTRIAXone (ROCEPHIN) 1 g in sodium chloride 0.9 % 100 mL IVPB  Status:  Discontinued     1 g 200 mL/hr over 30 Minutes Intravenous Daily 04/19/20 0610 04/20/20 1059   04/18/20 1800  remdesivir 100 mg in sodium chloride 0.9 % 100 mL IVPB     100 mg 200 mL/hr over 30 Minutes Intravenous Every 30 min 04/18/20 1754 04/18/20 2006       Time Spent in minutes  30   Lala Lund M.D on 04/20/2020 at 11:00 AM  To page go to [email or web address] - password [facility]  [facility] -  Office  [phone]    See all Orders from today for further details    Objective:   Vitals:   04/20/20 0618 04/20/20 0644 04/20/20  0731 04/20/20 0802  BP: 91/69 (!) 93/59  (!) 85/62  Pulse: 63 (!) 59 (!) 50 69  Resp: 20 (!) 35 (!) 27 (!) 24  Temp: 97.8 F (36.6 C)     TempSrc: Oral     SpO2: 92%  91% 91%  Weight:      Height:        Wt Readings from Last 3 Encounters:  04/18/20 70.5 kg  08/24/16 68.9 kg     Intake/Output Summary (Last 24 hours) at 04/20/2020 1100 Last data filed at 04/20/2020 0616 Gross per 24 hour  Intake 460 ml  Output 1200 ml  Net -740 ml     Physical Exam  Awake Alert, No new F.N deficits, Normal affect [place].AT,PERRAL Supple Neck,No JVD, No cervical lymphadenopathy appriciated.  Symmetrical Chest wall movement, Good air movement bilaterally, few rales RRR,No Gallops, Rubs or new Murmurs, No Parasternal Heave +ve B.Sounds, Abd Soft, No tenderness, No organomegaly appriciated, No rebound - guarding or rigidity. No Cyanosis, Clubbing or edema, No new Rash or bruise    Data Review:    CBC Recent Labs  Lab 04/18/20 1655 04/19/20 0627 04/20/20 0833  WBC 5.2 2.9* 8.6  HGB 13.7 10.6* 12.5  HCT 41.4 32.7* 38.3  PLT 323 275 412*  MCV 84.1 86.1 86.1  MCH 27.8 27.9 28.1  MCHC 33.1 32.4 32.6  RDW 13.6 13.3 13.3  LYMPHSABS 1.0 0.5* PENDING  MONOABS 0.3 0.1 PENDING  EOSABS 0.1 0.0 PENDING  BASOSABS 0.0 0.0 PENDING    Chemistries  Recent Labs  Lab 04/18/20 1655 04/19/20 0627 04/20/20 0833  NA 138 138 137  K 3.8 4.2 4.7  CL 100 106 103  CO2 25 22 20*  GLUCOSE 118* 181* 185*  BUN 17 17 14   CREATININE 0.55 0.46 0.52  CALCIUM 8.3* 7.5* 8.5*  AST 35 39 31  ALT 89* 76* 70*  ALKPHOS 87 68 70  BILITOT 1.2 0.8 1.3*  MG  --  2.1 2.3     ------------------------------------------------------------------------------------------------------------------ Recent Labs    04/18/20 1655  TRIG 253*    No results found for: HGBA1C ------------------------------------------------------------------------------------------------------------------ No results for input(s): TSH,  T4TOTAL, T3FREE, THYROIDAB in the last 72 hours.  Invalid input(s): FREET3  Cardiac Enzymes No results for input(s): CKMB, TROPONINI, MYOGLOBIN in the last 168 hours.  Invalid input(s): CK ------------------------------------------------------------------------------------------------------------------    Component Value Date/Time   BNP 29.6 04/19/2020 5883    Micro Results Recent Results (from the past 240 hour(s))  SARS Coronavirus 2 by RT PCR (hospital order, performed in [facility] hospital lab) Nasopharyngeal Nasopharyngeal Swab     Status: Abnormal   Collection Time:  04/18/20  4:56 PM   Specimen: Nasopharyngeal Swab  Result Value Ref Range Status   SARS Coronavirus 2 POSITIVE (A) NEGATIVE Final    Comment: RESULT CALLED TO, READ BACK BY AND VERIFIED WITH: CALLED TO S.COBLE AT 1838 ON [id number] BY SROY (NOTE) SARS-CoV-2 target nucleic acids are DETECTED SARS-CoV-2 RNA is generally detectable in upper respiratory specimens  during the acute phase of infection.  Positive results are indicative  of the presence of the identified virus, but do not rule out bacterial infection or co-infection with other pathogens not detected by the test.  Clinical correlation with patient history and  other diagnostic information is necessary to determine patient infection status.  The expected result is negative. Fact Sheet for Patients:   StrictlyIdeas.no  Fact Sheet for Healthcare Providers:   BankingDealers.co.za   This test is not yet approved or cleared by the [place] FDA and  has been authorized for detection and/or diagnosis of SARS-CoV-2 by FDA under an Emergency Use Authorization (EUA).  This EUA will remain in effect (meaning this t est can be used) for the duration of  the COVID-19 declaration under Section 564(b)(1) of the Act, 21 U.S.C. section 360-bbb-3(b)(1), unless the authorization is terminated or revoked  sooner. Performed at [facility], [address] [place], [place] [address]   Blood Culture (routine x 2)     Status: None (Preliminary result)   Collection Time: 04/18/20  5:00 PM   Specimen: BLOOD RIGHT ARM  Result Value Ref Range Status   Specimen Description   Final    BLOOD RIGHT ARM Performed at [facility], [address] [place], [place] [address]    Special Requests   Final    BOTTLES DRAWN AEROBIC AND ANAEROBIC Blood Culture adequate volume Performed at [facility], [address] [place], [place] [address]    Culture   Final    NO GROWTH 2 DAYS Performed at [facility], [place] [address], [place], [place] [address]    Report Status PENDING  Incomplete  Blood Culture (routine x 2)     Status: None (Preliminary result)   Collection Time: 04/18/20  5:10 PM   Specimen: Left Antecubital; Blood  Result Value Ref Range Status   Specimen Description   Final    LEFT ANTECUBITAL Performed at [facility], [address] [place], [place] [address]    Special Requests   Final    BOTTLES DRAWN AEROBIC AND ANAEROBIC Blood Culture adequate volume Performed at [facility], [address] [place], [place] [address]    Culture   Final    NO GROWTH 2 DAYS Performed at [facility], [place] [address] [place], [place] [address]    Report Status PENDING  Incomplete    Radiology Reports CT ANGIO CHEST PE W OR WO CONTRAST  Result Date: 04/19/2020 CLINICAL DATA:  Inpatient. Dyspnea. Intermittent cough. Desaturation. COVID-19 positive. EXAM: CT ANGIOGRAPHY CHEST WITH CONTRAST TECHNIQUE: Multidetector CT imaging of the chest was performed using the standard protocol during bolus administration of intravenous contrast. Multiplanar CT image reconstructions and MIPs were obtained to evaluate the vascular anatomy. CONTRAST:  14m OMNIPAQUE IOHEXOL 350 MG/ML SOLN COMPARISON:  Chest radiograph from one day prior. FINDINGS:  Cardiovascular: The study is high quality for the evaluation of pulmonary embolism. There are no filling defects in the central, lobar, segmental or subsegmental pulmonary artery branches to suggest  acute pulmonary embolism. Normal course and caliber of the thoracic aorta. Top-normal caliber main pulmonary artery (3.1 cm diameter). Normal heart size. Trace pericardial effusion/thickening. Mediastinum/Nodes: No discrete thyroid nodules. Unremarkable esophagus. No pathologically enlarged axillary, mediastinal or hilar lymph nodes. Lungs/Pleura: No pneumothorax. No pleural effusion. Extensive patchy peribronchovascular and peripheral consolidation throughout both lungs involving all lung lobes, most prominent in the lower lobes with associated air bronchograms. No discrete lung masses or significant pulmonary nodules. Upper abdomen: No acute abnormality. Musculoskeletal:  No aggressive appearing focal osseous lesions. Review of the MIP images confirms the above findings. IMPRESSION: 1. No pulmonary embolism. 2. Extensive patchy consolidation throughout both lungs with associated air bronchograms, compatible with severe multilobar bronchopneumonia due to COVID-19. 3. Trace pericardial effusion/thickening. Electronically Signed   By: Ilona Sorrel M.D.   On: 04/19/2020 07:00   DG Chest Port 1 View  Result Date: 04/20/2020 CLINICAL DATA:  Dyspnea, COVID-19 positive EXAM: PORTABLE CHEST 1 VIEW COMPARISON:  04/18/2020 chest radiograph. FINDINGS: Stable cardiomediastinal silhouette with normal heart size. No pneumothorax. No pleural effusion. Extensive patchy opacities throughout both lungs, mildly improved on the right. IMPRESSION: Extensive patchy opacities throughout both lungs, mildly improved on the right, compatible with COVID-19 pneumonia. Electronically Signed   By: Ilona Sorrel M.D.   On: 04/20/2020 07:47   DG Chest Port 1 View  Result Date: 04/18/2020 CLINICAL DATA:  COVID-19.  Shortness of breath.   Hypoxemia. EXAM: PORTABLE CHEST 1 VIEW COMPARISON:  None. FINDINGS: The patient has extensive bilateral pulmonary infiltrates, more severe on the right than the left. No effusions. Heart size and vascularity are normal. Bones are normal. IMPRESSION: Extensive bilateral pulmonary infiltrates, more severe on the right than the left, consistent with COVID pneumonia. Electronically Signed   By: Lorriane Shire M.D.   On: 04/18/2020 18:21

## 2020-04-20 NOTE — Progress Notes (Signed)
Patient attempted to get up to [facility] to void, became extremely SOB and anxious, Spo279% on oxygen at 15 L via [place]. Assisted back to bed. Patient took longer to rebound saturation, once at rest SpO2 86% on oxygen at 15 L via [place]. Placed on NRB and made RT aware. Patient bladder scanned in bladder. On call [place] paged to make aware, awaiting response.

## 2020-04-21 LAB — CBC WITH DIFFERENTIAL/PLATELET
Abs Immature Granulocytes: 0.12 10*3/uL — ABNORMAL HIGH (ref 0.00–0.07)
Basophils Absolute: 0 10*3/uL (ref 0.0–0.1)
Basophils Relative: 0 %
Eosinophils Absolute: 0 10*3/uL (ref 0.0–0.5)
Eosinophils Relative: 0 %
HCT: 39 % (ref 36.0–46.0)
Hemoglobin: 12.8 g/dL (ref 12.0–15.0)
Immature Granulocytes: 2 %
Lymphocytes Relative: 10 %
Lymphs Abs: 0.7 10*3/uL (ref 0.7–4.0)
MCH: 27.7 pg (ref 26.0–34.0)
MCHC: 32.8 g/dL (ref 30.0–36.0)
MCV: 84.4 fL (ref 80.0–100.0)
Monocytes Absolute: 0.3 10*3/uL (ref 0.1–1.0)
Monocytes Relative: 4 %
Neutro Abs: 6 10*3/uL (ref 1.7–7.7)
Neutrophils Relative %: 84 %
Platelets: 382 10*3/uL (ref 150–400)
RBC: 4.62 MIL/uL (ref 3.87–5.11)
RDW: 13 % (ref 11.5–15.5)
WBC: 7.1 10*3/uL (ref 4.0–10.5)
nRBC: 0 % (ref 0.0–0.2)

## 2020-04-21 LAB — COMPREHENSIVE METABOLIC PANEL
ALT: 63 U/L — ABNORMAL HIGH (ref 0–44)
AST: 23 U/L (ref 15–41)
Albumin: 2.4 g/dL — ABNORMAL LOW (ref 3.5–5.0)
Alkaline Phosphatase: 71 U/L (ref 38–126)
Anion gap: 10 (ref 5–15)
BUN: 12 mg/dL (ref 6–20)
CO2: 26 mmol/L (ref 22–32)
Calcium: 8.4 mg/dL — ABNORMAL LOW (ref 8.9–10.3)
Chloride: 100 mmol/L (ref 98–111)
Creatinine, Ser: 0.6 mg/dL (ref 0.44–1.00)
GFR calc Af Amer: >60 mL/min (ref >60)
GFR calc non Af Amer: >60 mL/min (ref >60)
Glucose, Bld: 281 mg/dL — ABNORMAL HIGH (ref 70–99)
Potassium: 4.3 mmol/L (ref 3.5–5.1)
Sodium: 136 mmol/L (ref 135–145)
Total Bilirubin: 0.9 mg/dL (ref 0.3–1.2)
Total Protein: 6.1 g/dL — ABNORMAL LOW (ref 6.5–8.1)

## 2020-04-21 LAB — D-DIMER, QUANTITATIVE: D-Dimer, Quant: 2.45 ug/mL-FEU — ABNORMAL HIGH (ref 0.00–0.50)

## 2020-04-21 LAB — PROCALCITONIN: Procalcitonin: <0.1 ng/mL

## 2020-04-21 LAB — MAGNESIUM: Magnesium: 2.2 mg/dL (ref 1.7–2.4)

## 2020-04-21 LAB — BRAIN NATRIURETIC PEPTIDE: B Natriuretic Peptide: 198.9 pg/mL — ABNORMAL HIGH (ref 0.0–100.0)

## 2020-04-21 LAB — C-REACTIVE PROTEIN: CRP: 2.1 mg/dL — ABNORMAL HIGH (ref <1.0)

## 2020-04-21 MED ORDER — LACTATED RINGERS IV SOLN: INTRAVENOUS | Status: AC

## 2020-04-21 MED ORDER — METHYLPREDNISOLONE SODIUM SUCC 125 MG IJ SOLR
60.0000 mg | Freq: Every day | INTRAMUSCULAR | Status: DC
  Administered 2020-04-21: 60 mg via INTRAVENOUS
  Filled 2020-04-21 (×2): qty 2

## 2020-04-21 MED ORDER — LACTATED RINGERS IV BOLUS
1000.0000 mL | Freq: Once | INTRAVENOUS | Status: AC
  Administered 2020-04-21: 1000 mL via INTRAVENOUS

## 2020-04-21 NOTE — Progress Notes (Signed)
PROGRESS NOTE                                                                                                                                                                                                             Patient Demographics:    Kelly Patel, is a 34 y.o. female, DOB - July 01, 1986, KPV:[id number]  Outpatient Primary MD for the patient is Patient, No Pcp Per    LOS - 3  Admit date - 04/18/2020    Chief Complaint  Patient presents with  . COVID Positive       Brief Narrative -  Kelly Patel is a 34 y.o. female with no significant past medical history presents to the ER at [facility] with worsening shortness of breath.  Patient states she was diagnosed with COVID-19 infection about 2 weeks ago when patient had headache fever chills, she has not been eating or drinking well for 2 weeks, for the last 4 days she started to get more and more short of breath.  In the ER diagnosed with severe acute hypoxic respiratory failure due to COVID-19 pneumonia along with severe dehydration and hypotension admitted to the hospital.   Subjective:   Patient in bed, appears comfortable, denies any headache, no fever, no chest pain or pressure, no shortness of breath , no abdominal pain. No focal weakness.   Assessment  & Plan :     1. Sepsis with severe Acute Hypoxic Resp. Failure due to Acute Covid 19 Viral Pneumonitis during the ongoing 2020 Covid 19 Pandemic - she has severe disease with extensive parenchymal lung disease ongoing for several days to a few weeks prior to admission.  She has been appropriately started on IV steroids, remdesivir and has received Actemra after consent in the ER.  Have increased her IV steroid dose, she is extremely tenuous and will be closely monitored.  If needed she will be transition to heated high flow.  Note this patient has severe COVID-19 related parenchymal lung injury, she is  requiring 10 L of high flow oxygen, she will desaturate with minimal activity and may take up to 20-30 minutes to recover, if she moves around her supplemental oxygen needs to be increased temporarily, may use nonrebreather mask in addition to high flow or switch her to heated high flow if needed.  Encouraged the patient  to sit up in chair in the daytime use I-S and flutter valve for pulmonary toiletry and then prone in bed when at night.  Will advance activity and titrate down oxygen as possible.   SpO2: 94 % O2 Flow Rate (L/min): 10 L/min  Recent Labs  Lab 04/18/20 1655 04/18/20 1656 04/19/20 0627 04/20/20 0833 04/21/20 0430  CRP 11.8*  --  8.3* 2.9* 2.1*  DDIMER 4.63*  --  3.73* 3.51* 2.45*  BNP  --   --  29.6 325.0* 198.9*  PROCALCITON <0.10  --  <0.10 <0.10 <0.10  SARSCOV2NAA  --  POSITIVE*  --   --   --     Hepatic Function Latest Ref Rng & Units 04/21/2020 04/20/2020 04/19/2020  Total Protein 6.5 - 8.1 g/dL 6.1(L) 6.2(L) 5.7(L)  Albumin 3.5 - 5.0 g/dL 2.4(L) 2.4(L) 2.0(L)  AST 15 - 41 U/L 23 31 39  ALT 0 - 44 U/L 63(H) 70(H) 76(H)  Alk Phosphatase 38 - 126 U/L 71 70 68  Total Bilirubin 0.3 - 1.2 mg/dL 0.9 1.3(H) 0.8    2.  Mild asymptomatic COVID-19 viral infection related transaminitis.  Trend.  3.  Severe dehydration with hypotension.  LR bolus followed by maintenance.   4.  Elevated D-dimer due to severe inflammation from COVID-19.  On high-dose Lovenox, if continues to trend high will check leg ultrasound and place her prophylactically on full dose for a few days, CTA no PE.    Condition - Extremely Guarded  Family Communication  : Left message for husband [phone] on 04/19/2020 at 8:53 AM, 04/20/2020 at 10:59 AM message left again  Code Status :  Full  Consults  :  None  Procedures  :    CTA - no PE, bilateral interstitial infiltrates.  PUD Prophylaxis : None  Disposition Plan  :    Status is: Inpatient  Remains inpatient appropriate because:IV  treatments appropriate due to intensity of illness or inability to take PO   Dispo: The patient is from: Home              Anticipated d/c is to: Home              Anticipated d/c date is: > 3 days              Patient currently is not medically stable to d/c.  Severe acute hypoxic respiratory failure due to COVID-19 pneumonia, on multiple liters of oxygen getting IV remdesivir and steroid treatment.   DVT Prophylaxis  :  Lovenox   Lab Results  Component Value Date   PLT 382 04/21/2020    Diet :  Diet Order            Diet regular Room service appropriate? Yes; Fluid consistency: Thin  Diet effective now               Inpatient Medications  Scheduled Meds: . Chlorhexidine Gluconate Cloth  6 each Topical Daily  . enoxaparin (LOVENOX) injection  50 mg Subcutaneous Q12H  . methylPREDNISolone (SOLU-MEDROL) injection  60 mg Intravenous Daily   Continuous Infusions: . lactated ringers 125 mL/hr at 04/21/20 0804  . remdesivir 100 mg in NS 100 mL 100 mg (04/21/20 1007)   PRN Meds:.guaiFENesin-dextromethorphan, [DISCONTINUED] ondansetron **OR** ondansetron (ZOFRAN) IV  Antibiotics  :    Anti-infectives (From admission, onward)   Start     Dose/Rate Route Frequency Ordered Stop   04/19/20 1000  remdesivir 100 mg in sodium chloride 0.9 % 100 mL  IVPB     100 mg 200 mL/hr over 30 Minutes Intravenous Daily 04/18/20 1754 04/23/20 0959   04/19/20 0615  cefTRIAXone (ROCEPHIN) 1 g in sodium chloride 0.9 % 100 mL IVPB  Status:  Discontinued     1 g 200 mL/hr over 30 Minutes Intravenous Daily 04/19/20 0610 04/20/20 1059   04/18/20 1800  remdesivir 100 mg in sodium chloride 0.9 % 100 mL IVPB     100 mg 200 mL/hr over 30 Minutes Intravenous Every 30 min 04/18/20 1754 04/18/20 2006       Time Spent in minutes  30   Lala Lund M.D on 04/21/2020 at 10:10 AM  To page go to [email or web address] - password [facility]  [facility] -  Office  [phone]    See all Orders from today  for further details    Objective:   Vitals:   04/20/20 1932 04/21/20 0002 04/21/20 0431 04/21/20 0732  BP: 91/63 102/66 96/61 (!) 88/66  Pulse: 64 69 (!) 59 65  Resp: (!) 27 (!) 30 (!) 22 (!) 21  Temp: 98 F (36.7 C) 98.1 F (36.7 C) 98 F (36.7 C) 97.6 F (36.4 C)  TempSrc: Oral Oral Oral Oral  SpO2: 93% 92% 94% 94%  Weight:      Height:        Wt Readings from Last 3 Encounters:  04/18/20 70.5 kg  08/24/16 68.9 kg     Intake/Output Summary (Last 24 hours) at 04/21/2020 1010 Last data filed at 04/21/2020 0700 Gross per 24 hour  Intake 580 ml  Output 3400 ml  Net -2820 ml     Physical Exam  Awake Alert, No new F.N deficits, Normal affect [place].AT,PERRAL Supple Neck,No JVD, No cervical lymphadenopathy appriciated.  Symmetrical Chest wall movement, Good air movement bilaterally, CTAB RRR,No Gallops, Rubs or new Murmurs, No Parasternal Heave +ve B.Sounds, Abd Soft, No tenderness, No organomegaly appriciated, No rebound - guarding or rigidity. No Cyanosis, Clubbing or edema, No new Rash or bruise    Data Review:    CBC Recent Labs  Lab 04/18/20 1655 04/19/20 0627 04/20/20 0833 04/21/20 0430  WBC 5.2 2.9* 8.6 7.1  HGB 13.7 10.6* 12.5 12.8  HCT 41.4 32.7* 38.3 39.0  PLT 323 275 412* 382  MCV 84.1 86.1 86.1 84.4  MCH 27.8 27.9 28.1 27.7  MCHC 33.1 32.4 32.6 32.8  RDW 13.6 13.3 13.3 13.0  LYMPHSABS 1.0 0.5* 0.8 0.7  MONOABS 0.3 0.1 0.3 0.3  EOSABS 0.1 0.0 0.0 0.0  BASOSABS 0.0 0.0 0.0 0.0    Chemistries  Recent Labs  Lab 04/18/20 1655 04/19/20 0627 04/20/20 0833 04/21/20 0430  NA 138 138 137 136  K 3.8 4.2 4.7 4.3  CL 100 106 103 100  CO2 25 22 20* 26  GLUCOSE 118* 181* 185* 281*  BUN 17 17 14 12   CREATININE 0.55 0.46 0.52 0.60  CALCIUM 8.3* 7.5* 8.5* 8.4*  AST 35 39 31 23  ALT 89* 76* 70* 63*  ALKPHOS 87 68 70 71  BILITOT 1.2 0.8 1.3* 0.9  MG  --  2.1 2.3 2.2       ------------------------------------------------------------------------------------------------------------------ Recent Labs    04/18/20 1655  TRIG 253*    No results found for: HGBA1C ------------------------------------------------------------------------------------------------------------------ No results for input(s): TSH, T4TOTAL, T3FREE, THYROIDAB in the last 72 hours.  Invalid input(s): FREET3  Cardiac Enzymes No results for input(s): CKMB, TROPONINI, MYOGLOBIN in the last 168 hours.  Invalid input(s): CK ------------------------------------------------------------------------------------------------------------------    Component  Value Date/Time   BNP 198.9 (H) 04/21/2020 0430    Micro Results Recent Results (from the past 240 hour(s))  SARS Coronavirus 2 by RT PCR (hospital order, performed in [facility] hospital lab) Nasopharyngeal Nasopharyngeal Swab     Status: Abnormal   Collection Time: 04/18/20  4:56 PM   Specimen: Nasopharyngeal Swab  Result Value Ref Range Status   SARS Coronavirus 2 POSITIVE (A) NEGATIVE Final    Comment: RESULT CALLED TO, READ BACK BY AND VERIFIED WITH: CALLED TO S.COBLE AT 1838 ON [id number] BY SROY (NOTE) SARS-CoV-2 target nucleic acids are DETECTED SARS-CoV-2 RNA is generally detectable in upper respiratory specimens  during the acute phase of infection.  Positive results are indicative  of the presence of the identified virus, but do not rule out bacterial infection or co-infection with other pathogens not detected by the test.  Clinical correlation with patient history and  other diagnostic information is necessary to determine patient infection status.  The expected result is negative. Fact Sheet for Patients:   StrictlyIdeas.no  Fact Sheet for Healthcare Providers:   BankingDealers.co.za   This test is not yet approved or cleared by the [place] FDA and  has been authorized  for detection and/or diagnosis of SARS-CoV-2 by FDA under an Emergency Use Authorization (EUA).  This EUA will remain in effect (meaning this t est can be used) for the duration of  the COVID-19 declaration under Section 564(b)(1) of the Act, 21 U.S.C. section 360-bbb-3(b)(1), unless the authorization is terminated or revoked sooner. Performed at [facility], [address] [place], [place] [address]   Blood Culture (routine x 2)     Status: None (Preliminary result)   Collection Time: 04/18/20  5:00 PM   Specimen: BLOOD RIGHT ARM  Result Value Ref Range Status   Specimen Description   Final    BLOOD RIGHT ARM Performed at [facility], [address] [place], [place] [address]    Special Requests   Final    BOTTLES DRAWN AEROBIC AND ANAEROBIC Blood Culture adequate volume Performed at [facility], [address] [place], [place] [address]    Culture   Final    NO GROWTH 3 DAYS Performed at [facility], [place] [address], [place], [place] [address]    Report Status PENDING  Incomplete  Blood Culture (routine x 2)     Status: None (Preliminary result)   Collection Time: 04/18/20  5:10 PM   Specimen: Left Antecubital; Blood  Result Value Ref Range Status   Specimen Description   Final    LEFT ANTECUBITAL Performed at [facility], [address] [place], [place] [address]    Special Requests   Final    BOTTLES DRAWN AEROBIC AND ANAEROBIC Blood Culture adequate volume Performed at [facility], [address] [place], [place] [address]    Culture   Final    NO GROWTH 3 DAYS Performed at [facility], [place] [address] [place], [place] [address]    Report Status PENDING  Incomplete    Radiology Reports CT ANGIO CHEST PE W OR WO CONTRAST  Result Date: 04/19/2020 CLINICAL DATA:  Inpatient. Dyspnea. Intermittent cough. Desaturation. COVID-19 positive. EXAM: CT ANGIOGRAPHY CHEST WITH CONTRAST TECHNIQUE:  Multidetector CT imaging of the chest was performed using the standard protocol during bolus administration of intravenous contrast. Multiplanar CT image reconstructions and MIPs were obtained to evaluate  the vascular anatomy. CONTRAST:  172m OMNIPAQUE IOHEXOL 350 MG/ML SOLN COMPARISON:  Chest radiograph from one day prior. FINDINGS: Cardiovascular: The study is high quality for the evaluation of pulmonary embolism. There are no filling defects in the central, lobar, segmental or subsegmental pulmonary artery branches to suggest acute pulmonary embolism. Normal course and caliber of the thoracic aorta. Top-normal caliber main pulmonary artery (3.1 cm diameter). Normal heart size. Trace pericardial effusion/thickening. Mediastinum/Nodes: No discrete thyroid nodules. Unremarkable esophagus. No pathologically enlarged axillary, mediastinal or hilar lymph nodes. Lungs/Pleura: No pneumothorax. No pleural effusion. Extensive patchy peribronchovascular and peripheral consolidation throughout both lungs involving all lung lobes, most prominent in the lower lobes with associated air bronchograms. No discrete lung masses or significant pulmonary nodules. Upper abdomen: No acute abnormality. Musculoskeletal:  No aggressive appearing focal osseous lesions. Review of the MIP images confirms the above findings. IMPRESSION: 1. No pulmonary embolism. 2. Extensive patchy consolidation throughout both lungs with associated air bronchograms, compatible with severe multilobar bronchopneumonia due to COVID-19. 3. Trace pericardial effusion/thickening. Electronically Signed   By: JIlona SorrelM.D.   On: 04/19/2020 07:00   DG Chest Port 1 View  Result Date: 04/20/2020 CLINICAL DATA:  Dyspnea, COVID-19 positive EXAM: PORTABLE CHEST 1 VIEW COMPARISON:  04/18/2020 chest radiograph. FINDINGS: Stable cardiomediastinal silhouette with normal heart size. No pneumothorax. No pleural effusion. Extensive patchy opacities throughout both lungs,  mildly improved on the right. IMPRESSION: Extensive patchy opacities throughout both lungs, mildly improved on the right, compatible with COVID-19 pneumonia. Electronically Signed   By: JIlona SorrelM.D.   On: 04/20/2020 07:47   DG Chest Port 1 View  Result Date: 04/18/2020 CLINICAL DATA:  COVID-19.  Shortness of breath.  Hypoxemia. EXAM: PORTABLE CHEST 1 VIEW COMPARISON:  None. FINDINGS: The patient has extensive bilateral pulmonary infiltrates, more severe on the right than the left. No effusions. Heart size and vascularity are normal. Bones are normal. IMPRESSION: Extensive bilateral pulmonary infiltrates, more severe on the right than the left, consistent with COVID pneumonia. Electronically Signed   By: JLorriane ShireM.D.   On: 04/18/2020 18:21

## 2020-04-22 LAB — CBC WITH DIFFERENTIAL/PLATELET
Abs Immature Granulocytes: 0.11 10*3/uL — ABNORMAL HIGH (ref 0.00–0.07)
Basophils Absolute: 0 10*3/uL (ref 0.0–0.1)
Basophils Relative: 0 %
Eosinophils Absolute: 0 10*3/uL (ref 0.0–0.5)
Eosinophils Relative: 0 %
HCT: 38.6 % (ref 36.0–46.0)
Hemoglobin: 12.8 g/dL (ref 12.0–15.0)
Immature Granulocytes: 2 %
Lymphocytes Relative: 18 %
Lymphs Abs: 1.2 10*3/uL (ref 0.7–4.0)
MCH: 28.1 pg (ref 26.0–34.0)
MCHC: 33.2 g/dL (ref 30.0–36.0)
MCV: 84.6 fL (ref 80.0–100.0)
Monocytes Absolute: 0.6 10*3/uL (ref 0.1–1.0)
Monocytes Relative: 9 %
Neutro Abs: 5 10*3/uL (ref 1.7–7.7)
Neutrophils Relative %: 71 %
Platelets: 329 10*3/uL (ref 150–400)
RBC: 4.56 MIL/uL (ref 3.87–5.11)
RDW: 13 % (ref 11.5–15.5)
WBC: 6.9 10*3/uL (ref 4.0–10.5)
nRBC: 0 % (ref 0.0–0.2)

## 2020-04-22 LAB — BRAIN NATRIURETIC PEPTIDE: B Natriuretic Peptide: 69.5 pg/mL (ref 0.0–100.0)

## 2020-04-22 LAB — COMPREHENSIVE METABOLIC PANEL
ALT: 80 U/L — ABNORMAL HIGH (ref 0–44)
AST: 38 U/L (ref 15–41)
Albumin: 2.4 g/dL — ABNORMAL LOW (ref 3.5–5.0)
Alkaline Phosphatase: 59 U/L (ref 38–126)
Anion gap: 8 (ref 5–15)
BUN: 10 mg/dL (ref 6–20)
CO2: 28 mmol/L (ref 22–32)
Calcium: 8.4 mg/dL — ABNORMAL LOW (ref 8.9–10.3)
Chloride: 100 mmol/L (ref 98–111)
Creatinine, Ser: 0.65 mg/dL (ref 0.44–1.00)
GFR calc Af Amer: >60 mL/min (ref >60)
GFR calc non Af Amer: >60 mL/min (ref >60)
Glucose, Bld: 198 mg/dL — ABNORMAL HIGH (ref 70–99)
Potassium: 4.1 mmol/L (ref 3.5–5.1)
Sodium: 136 mmol/L (ref 135–145)
Total Bilirubin: 0.6 mg/dL (ref 0.3–1.2)
Total Protein: 6.2 g/dL — ABNORMAL LOW (ref 6.5–8.1)

## 2020-04-22 LAB — MAGNESIUM: Magnesium: 2.3 mg/dL (ref 1.7–2.4)

## 2020-04-22 LAB — C-REACTIVE PROTEIN: CRP: 0.9 mg/dL (ref <1.0)

## 2020-04-22 LAB — D-DIMER, QUANTITATIVE: D-Dimer, Quant: 2.01 ug/mL-FEU — ABNORMAL HIGH (ref 0.00–0.50)

## 2020-04-22 MED ORDER — LACTATED RINGERS IV BOLUS
1000.0000 mL | Freq: Once | INTRAVENOUS | Status: AC
  Administered 2020-04-22: 1000 mL via INTRAVENOUS

## 2020-04-22 MED ORDER — METHYLPREDNISOLONE SODIUM SUCC 40 MG IJ SOLR
40.0000 mg | Freq: Every day | INTRAMUSCULAR | Status: DC
  Administered 2020-04-22 – 2020-04-24 (×3): 40 mg via INTRAVENOUS
  Filled 2020-04-22 (×3): qty 1

## 2020-04-22 NOTE — Care Management (Signed)
CM acknowledges order for home oxygen however insurance approval requires the pulmonary ambulatory test for qualification.  Bedside nurse to perform and document findings.

## 2020-04-22 NOTE — Progress Notes (Signed)
Foley catheter d/c'ed per MD order

## 2020-04-22 NOTE — Progress Notes (Addendum)
PROGRESS NOTE                                                                                                                                                                                                             Patient Demographics:    Kelly Patel, is a 34 y.o. female, DOB - 1985/11/28, GYF:[id number]  Outpatient Primary MD for the patient is Patient, No Pcp Per    LOS - 4  Admit date - 04/18/2020    Chief Complaint  Patient presents with  . COVID Positive       Brief Narrative -  Kelly Patel is a 34 y.o. female with no significant past medical history presents to the ER at [facility] with worsening shortness of breath.  Patient states she was diagnosed with COVID-19 infection about 2 weeks ago when patient had headache fever chills, she has not been eating or drinking well for 2 weeks, for the last 4 days she started to get more and more short of breath.  In the ER diagnosed with severe acute hypoxic respiratory failure due to COVID-19 pneumonia along with severe dehydration and hypotension admitted to the hospital.   Subjective:   Patient in bed, appears comfortable, denies any headache, no fever, no chest pain or pressure, much improved shortness of breath , no abdominal pain. No focal weakness.   Assessment  & Plan :     Sepsis with severe Acute Hypoxic Resp. Failure due to Acute Covid 19 Viral Pneumonitis during the ongoing 2020 Covid 19 Pandemic - she has severe disease with extensive parenchymal lung disease ongoing for several days to a few weeks prior to admission.  She has been appropriately started on IV steroids, remdesivir and has received Actemra after consent in the ER.  He has shown remarkable improvement and she is down to 1 to 2 L nasal cannula oxygen down from 15 to 20 L on admission.  Continue to advance activity, continue to use I-S and flutter valve in the daytime for pulmonary toiletry  and prone at night.  If she continues to show improvement likely home in the next 1 to 2 days, may require oxygen temporarily at home, 2 L nasal cannula oxygen for home has been ordered.   SpO2: 94 % O2 Flow Rate (L/min): 3 L/min  Recent Labs  Lab  04/18/20 1655 04/18/20 1656 04/19/20 0627 04/20/20 0833 04/21/20 0430 04/22/20 0555  CRP 11.8*  --  8.3* 2.9* 2.1* 0.9  DDIMER 4.63*  --  3.73* 3.51* 2.45* 2.01*  BNP  --   --  29.6 325.0* 198.9* 69.5  PROCALCITON <0.10  --  <0.10 <0.10 <0.10  --   SARSCOV2NAA  --  POSITIVE*  --   --   --   --     Hepatic Function Latest Ref Rng & Units 04/22/2020 04/21/2020 04/20/2020  Total Protein 6.5 - 8.1 g/dL 6.2(L) 6.1(L) 6.2(L)  Albumin 3.5 - 5.0 g/dL 2.4(L) 2.4(L) 2.4(L)  AST 15 - 41 U/L 38 23 31  ALT 0 - 44 U/L 80(H) 63(H) 70(H)  Alk Phosphatase 38 - 126 U/L 59 71 70  Total Bilirubin 0.3 - 1.2 mg/dL 0.6 0.9 1.3(H)    2.  Mild asymptomatic COVID-19 viral infection related transaminitis.  Trend.  3.  Severe dehydration with hypotension.  LR bolus followed by maintenance.   4.  Elevated D-dimer due to severe inflammation from COVID-19.  On high prophylactic dose Lovenox and D-dimer is trending down.  Continue this dose until she is in the hospital trend D-dimer, CTA was negative, will check leg ultrasound.    Condition - Extremely Guarded  Family Communication  : Left message for husband [phone] on 04/19/2020 at 8:53 AM, 04/20/2020 at 10:59 AM message left again  Code Status :  Full  Consults  :  None  Procedures  :    Leg [place]  CTA - no PE, bilateral interstitial infiltrates.  PUD Prophylaxis : None  Disposition Plan  :    Status is: Inpatient  Remains inpatient appropriate because:IV treatments appropriate due to intensity of illness or inability to take PO   Dispo: The patient is from: Home              Anticipated d/c is to: Home              Anticipated d/c date is: 1-2 days              Patient currently is not  medically stable to d/c.  Severe acute hypoxic respiratory failure due to COVID-19 pneumonia, on multiple liters of oxygen getting IV remdesivir and steroid treatment.   DVT Prophylaxis  :  Lovenox   Lab Results  Component Value Date   PLT 329 04/22/2020    Diet :  Diet Order            Diet regular Room service appropriate? Yes; Fluid consistency: Thin  Diet effective now               Inpatient Medications  Scheduled Meds: . Chlorhexidine Gluconate Cloth  6 each Topical Daily  . enoxaparin (LOVENOX) injection  50 mg Subcutaneous Q12H  . methylPREDNISolone (SOLU-MEDROL) injection  40 mg Intravenous Daily   Continuous Infusions:  PRN Meds:.guaiFENesin-dextromethorphan, [DISCONTINUED] ondansetron **OR** ondansetron (ZOFRAN) IV  Antibiotics  :    Anti-infectives (From admission, onward)   Start     Dose/Rate Route Frequency Ordered Stop   04/19/20 1000  remdesivir 100 mg in sodium chloride 0.9 % 100 mL IVPB     100 mg 200 mL/hr over 30 Minutes Intravenous Daily 04/18/20 1754 04/22/20 1016   04/19/20 0615  cefTRIAXone (ROCEPHIN) 1 g in sodium chloride 0.9 % 100 mL IVPB  Status:  Discontinued     1 g 200 mL/hr over 30 Minutes Intravenous Daily 04/19/20 0610 04/20/20 1059  04/18/20 1800  remdesivir 100 mg in sodium chloride 0.9 % 100 mL IVPB     100 mg 200 mL/hr over 30 Minutes Intravenous Every 30 min 04/18/20 1754 04/18/20 2006       Time Spent in minutes  30   Lala Lund M.D on 04/22/2020 at 10:35 AM  To page go to [email or web address] - password [facility]  [facility] -  Office  [phone]    See all Orders from today for further details    Objective:   Vitals:   04/22/20 0419 04/22/20 0420 04/22/20 0421 04/22/20 0729  BP:    93/61  Pulse: (!) 51 61 (!) 55   Resp: 17 (!) 21 18 20   Temp:    97.6 F (36.4 C)  TempSrc:    Oral  SpO2: 94% 94% 93% 94%  Weight:      Height:        Wt Readings from Last 3 Encounters:  04/18/20 70.5 kg  08/24/16  68.9 kg     Intake/Output Summary (Last 24 hours) at 04/22/2020 1035 Last data filed at 04/22/2020 0735 Gross per 24 hour  Intake 1894.9 ml  Output 2325 ml  Net -430.1 ml     Physical Exam  Awake Alert, No new F.N deficits, Normal affect [place].AT,PERRAL Supple Neck,No JVD, No cervical lymphadenopathy appriciated.  Symmetrical Chest wall movement, Good air movement bilaterally, CTAB RRR,No Gallops, Rubs or new Murmurs, No Parasternal Heave +ve B.Sounds, Abd Soft, No tenderness, No organomegaly appriciated, No rebound - guarding or rigidity. No Cyanosis, Clubbing or edema, No new Rash or bruise    Data Review:    CBC Recent Labs  Lab 04/18/20 1655 04/19/20 0627 04/20/20 0833 04/21/20 0430 04/22/20 0555  WBC 5.2 2.9* 8.6 7.1 6.9  HGB 13.7 10.6* 12.5 12.8 12.8  HCT 41.4 32.7* 38.3 39.0 38.6  PLT 323 275 412* 382 329  MCV 84.1 86.1 86.1 84.4 84.6  MCH 27.8 27.9 28.1 27.7 28.1  MCHC 33.1 32.4 32.6 32.8 33.2  RDW 13.6 13.3 13.3 13.0 13.0  LYMPHSABS 1.0 0.5* 0.8 0.7 1.2  MONOABS 0.3 0.1 0.3 0.3 0.6  EOSABS 0.1 0.0 0.0 0.0 0.0  BASOSABS 0.0 0.0 0.0 0.0 0.0    Chemistries  Recent Labs  Lab 04/18/20 1655 04/19/20 0627 04/20/20 0833 04/21/20 0430 04/22/20 0555  NA 138 138 137 136 136  K 3.8 4.2 4.7 4.3 4.1  CL 100 106 103 100 100  CO2 25 22 20* 26 28  GLUCOSE 118* 181* 185* 281* 198*  BUN 17 17 14 12 10   CREATININE 0.55 0.46 0.52 0.60 0.65  CALCIUM 8.3* 7.5* 8.5* 8.4* 8.4*  AST 35 39 31 23 38  ALT 89* 76* 70* 63* 80*  ALKPHOS 87 68 70 71 59  BILITOT 1.2 0.8 1.3* 0.9 0.6  MG  --  2.1 2.3 2.2 2.3     ------------------------------------------------------------------------------------------------------------------ No results for input(s): CHOL, HDL, LDLCALC, TRIG, CHOLHDL, LDLDIRECT in the last 72 hours.  No results found for: HGBA1C ------------------------------------------------------------------------------------------------------------------ No results for  input(s): TSH, T4TOTAL, T3FREE, THYROIDAB in the last 72 hours.  Invalid input(s): FREET3  Cardiac Enzymes No results for input(s): CKMB, TROPONINI, MYOGLOBIN in the last 168 hours.  Invalid input(s): CK ------------------------------------------------------------------------------------------------------------------    Component Value Date/Time   BNP 69.5 04/22/2020 0555    Micro Results Recent Results (from the past 240 hour(s))  SARS Coronavirus 2 by RT PCR (hospital order, performed in [facility] hospital lab) Nasopharyngeal Nasopharyngeal Swab  Status: Abnormal   Collection Time: 04/18/20  4:56 PM   Specimen: Nasopharyngeal Swab  Result Value Ref Range Status   SARS Coronavirus 2 POSITIVE (A) NEGATIVE Final    Comment: RESULT CALLED TO, READ BACK BY AND VERIFIED WITH: CALLED TO S.COBLE AT 1838 ON [id number] BY SROY (NOTE) SARS-CoV-2 target nucleic acids are DETECTED SARS-CoV-2 RNA is generally detectable in upper respiratory specimens  during the acute phase of infection.  Positive results are indicative  of the presence of the identified virus, but do not rule out bacterial infection or co-infection with other pathogens not detected by the test.  Clinical correlation with patient history and  other diagnostic information is necessary to determine patient infection status.  The expected result is negative. Fact Sheet for Patients:   StrictlyIdeas.no  Fact Sheet for Healthcare Providers:   BankingDealers.co.za   This test is not yet approved or cleared by the [place] FDA and  has been authorized for detection and/or diagnosis of SARS-CoV-2 by FDA under an Emergency Use Authorization (EUA).  This EUA will remain in effect (meaning this t est can be used) for the duration of  the COVID-19 declaration under Section 564(b)(1) of the Act, 21 U.S.C. section 360-bbb-3(b)(1), unless the authorization is terminated or revoked  sooner. Performed at [facility], [address] [place], [place] [address]   Blood Culture (routine x 2)     Status: None (Preliminary result)   Collection Time: 04/18/20  5:00 PM   Specimen: BLOOD RIGHT ARM  Result Value Ref Range Status   Specimen Description   Final    BLOOD RIGHT ARM Performed at [facility], [address] [place], [place] [address]    Special Requests   Final    BOTTLES DRAWN AEROBIC AND ANAEROBIC Blood Culture adequate volume Performed at [facility], [address] [place], [place] [address]    Culture   Final    NO GROWTH 4 DAYS Performed at [facility], [place] [address] [place], [place] [address]    Report Status PENDING  Incomplete  Blood Culture (routine x 2)     Status: None (Preliminary result)   Collection Time: 04/18/20  5:10 PM   Specimen: Left Antecubital; Blood  Result Value Ref Range Status   Specimen Description   Final    LEFT ANTECUBITAL Performed at [facility], [address] [place], [place] [address]    Special Requests   Final    BOTTLES DRAWN AEROBIC AND ANAEROBIC Blood Culture adequate volume Performed at [facility], [address] [place], [place] [address]    Culture   Final    NO GROWTH 4 DAYS Performed at [facility], [place] [address], [place], [place] [address]    Report Status PENDING  Incomplete    Radiology Reports CT ANGIO CHEST PE W OR WO CONTRAST  Result Date: 04/19/2020 CLINICAL DATA:  Inpatient. Dyspnea. Intermittent cough. Desaturation. COVID-19 positive. EXAM: CT ANGIOGRAPHY CHEST WITH CONTRAST TECHNIQUE: Multidetector CT imaging of the chest was performed using the standard protocol during bolus administration of intravenous contrast. Multiplanar CT image reconstructions and MIPs were obtained to evaluate the vascular anatomy. CONTRAST:  172m OMNIPAQUE IOHEXOL 350 MG/ML SOLN COMPARISON:  Chest radiograph from one day prior. FINDINGS:  Cardiovascular: The study is high quality for the evaluation of pulmonary embolism. There are no filling defects in the central, lobar, segmental or  subsegmental pulmonary artery branches to suggest acute pulmonary embolism. Normal course and caliber of the thoracic aorta. Top-normal caliber main pulmonary artery (3.1 cm diameter). Normal heart size. Trace pericardial effusion/thickening. Mediastinum/Nodes: No discrete thyroid nodules. Unremarkable esophagus. No pathologically enlarged axillary, mediastinal or hilar lymph nodes. Lungs/Pleura: No pneumothorax. No pleural effusion. Extensive patchy peribronchovascular and peripheral consolidation throughout both lungs involving all lung lobes, most prominent in the lower lobes with associated air bronchograms. No discrete lung masses or significant pulmonary nodules. Upper abdomen: No acute abnormality. Musculoskeletal:  No aggressive appearing focal osseous lesions. Review of the MIP images confirms the above findings. IMPRESSION: 1. No pulmonary embolism. 2. Extensive patchy consolidation throughout both lungs with associated air bronchograms, compatible with severe multilobar bronchopneumonia due to COVID-19. 3. Trace pericardial effusion/thickening. Electronically Signed   By: Ilona Sorrel M.D.   On: 04/19/2020 07:00   DG Chest Port 1 View  Result Date: 04/20/2020 CLINICAL DATA:  Dyspnea, COVID-19 positive EXAM: PORTABLE CHEST 1 VIEW COMPARISON:  04/18/2020 chest radiograph. FINDINGS: Stable cardiomediastinal silhouette with normal heart size. No pneumothorax. No pleural effusion. Extensive patchy opacities throughout both lungs, mildly improved on the right. IMPRESSION: Extensive patchy opacities throughout both lungs, mildly improved on the right, compatible with COVID-19 pneumonia. Electronically Signed   By: Ilona Sorrel M.D.   On: 04/20/2020 07:47   DG Chest Port 1 View  Result Date: 04/18/2020 CLINICAL DATA:  COVID-19.  Shortness of breath.   Hypoxemia. EXAM: PORTABLE CHEST 1 VIEW COMPARISON:  None. FINDINGS: The patient has extensive bilateral pulmonary infiltrates, more severe on the right than the left. No effusions. Heart size and vascularity are normal. Bones are normal. IMPRESSION: Extensive bilateral pulmonary infiltrates, more severe on the right than the left, consistent with COVID pneumonia. Electronically Signed   By: Lorriane Shire M.D.   On: 04/18/2020 18:21

## 2020-04-22 NOTE — Progress Notes (Signed)
SATURATION QUALIFICATIONS: (This note is used to comply with regulatory documentation for home oxygen)  Patient Saturations on Room Air at Rest = 88%  Patient Saturations on Room Air while Ambulating = 77%  Patient Saturations on 2 Liters of oxygen while Ambulating = 91%  Please briefly explain why patient needs home oxygen: Pt requiring O2 at this time due to desaturation when ambulating down hallway

## 2020-04-23 ENCOUNTER — Inpatient Hospital Stay (HOSPITAL_COMMUNITY): Payer: 59

## 2020-04-23 DIAGNOSIS — R7989 Other specified abnormal findings of blood chemistry: Secondary | ICD-10-CM

## 2020-04-23 DIAGNOSIS — U071 COVID-19: Secondary | ICD-10-CM

## 2020-04-23 DIAGNOSIS — J9601 Acute respiratory failure with hypoxia: Secondary | ICD-10-CM

## 2020-04-23 LAB — CBC WITH DIFFERENTIAL/PLATELET
Abs Immature Granulocytes: 0.19 10*3/uL — ABNORMAL HIGH (ref 0.00–0.07)
Basophils Absolute: 0 10*3/uL (ref 0.0–0.1)
Basophils Relative: 0 %
Eosinophils Absolute: 0 10*3/uL (ref 0.0–0.5)
Eosinophils Relative: 0 %
HCT: 42.8 % (ref 36.0–46.0)
Hemoglobin: 14 g/dL (ref 12.0–15.0)
Immature Granulocytes: 3 %
Lymphocytes Relative: 24 %
Lymphs Abs: 1.8 10*3/uL (ref 0.7–4.0)
MCH: 27.5 pg (ref 26.0–34.0)
MCHC: 32.7 g/dL (ref 30.0–36.0)
MCV: 83.9 fL (ref 80.0–100.0)
Monocytes Absolute: 0.5 10*3/uL (ref 0.1–1.0)
Monocytes Relative: 8 %
Neutro Abs: 4.7 10*3/uL (ref 1.7–7.7)
Neutrophils Relative %: 65 %
Platelets: 326 10*3/uL (ref 150–400)
RBC: 5.1 MIL/uL (ref 3.87–5.11)
RDW: 13.1 % (ref 11.5–15.5)
WBC: 7.2 10*3/uL (ref 4.0–10.5)
nRBC: 0 % (ref 0.0–0.2)

## 2020-04-23 LAB — C-REACTIVE PROTEIN: CRP: 0.8 mg/dL (ref <1.0)

## 2020-04-23 LAB — COMPREHENSIVE METABOLIC PANEL
ALT: 78 U/L — ABNORMAL HIGH (ref 0–44)
AST: 22 U/L (ref 15–41)
Albumin: 2.8 g/dL — ABNORMAL LOW (ref 3.5–5.0)
Alkaline Phosphatase: 64 U/L (ref 38–126)
Anion gap: 11 (ref 5–15)
BUN: 13 mg/dL (ref 6–20)
CO2: 29 mmol/L (ref 22–32)
Calcium: 8.7 mg/dL — ABNORMAL LOW (ref 8.9–10.3)
Chloride: 96 mmol/L — ABNORMAL LOW (ref 98–111)
Creatinine, Ser: 0.65 mg/dL (ref 0.44–1.00)
GFR calc Af Amer: >60 mL/min (ref >60)
GFR calc non Af Amer: >60 mL/min (ref >60)
Glucose, Bld: 139 mg/dL — ABNORMAL HIGH (ref 70–99)
Potassium: 4.3 mmol/L (ref 3.5–5.1)
Sodium: 136 mmol/L (ref 135–145)
Total Bilirubin: 1 mg/dL (ref 0.3–1.2)
Total Protein: 6.3 g/dL — ABNORMAL LOW (ref 6.5–8.1)

## 2020-04-23 LAB — CULTURE, BLOOD (ROUTINE X 2)
Culture: NO GROWTH
Culture: NO GROWTH
Special Requests: ADEQUATE
Special Requests: ADEQUATE

## 2020-04-23 LAB — MAGNESIUM: Magnesium: 2.2 mg/dL (ref 1.7–2.4)

## 2020-04-23 LAB — BRAIN NATRIURETIC PEPTIDE: B Natriuretic Peptide: 22.4 pg/mL (ref 0.0–100.0)

## 2020-04-23 LAB — D-DIMER, QUANTITATIVE: D-Dimer, Quant: 2.07 ug/mL-FEU — ABNORMAL HIGH (ref 0.00–0.50)

## 2020-04-23 MED ORDER — POLYETHYLENE GLYCOL 3350 17 G PO PACK
34.0000 g | PACK | Freq: Once | ORAL | Status: DC
  Filled 2020-04-23: qty 2

## 2020-04-23 MED ORDER — ZOLPIDEM TARTRATE 5 MG PO TABS: 5.0000 mg | ORAL_TABLET | Freq: Every evening | ORAL | Status: DC | PRN

## 2020-04-23 MED ORDER — BISACODYL 5 MG PO TBEC
10.0000 mg | DELAYED_RELEASE_TABLET | Freq: Once | ORAL | Status: DC
  Filled 2020-04-23: qty 2

## 2020-04-23 NOTE — Progress Notes (Signed)
Lower venous duplex       has been completed. Preliminary results can be found under CV proc through chart review. Deklin Bieler, BS, RDMS, RVT   

## 2020-04-23 NOTE — Progress Notes (Signed)
PROGRESS NOTE                                                                                                                                                                                                             Kelly Patel Demographics:    Kelly Patel, is a 34 y.o. female, DOB - 1986-06-30, KFM:[id number]  Outpatient Primary MD for the Kelly Patel is Kelly Patel, No Pcp Per    LOS - 5  Admit date - 04/18/2020    Chief Complaint  Kelly Patel presents with  . COVID Positive       Brief Narrative -  Kelly Patel is a 34 y.o. female with no significant past medical history presents to the ER at [facility] with worsening shortness of breath.  Kelly Patel states she was diagnosed with COVID-19 infection about 2 weeks ago when Kelly Patel had headache fever chills, she has not been eating or drinking well for 2 weeks, for the last 4 days she started to get more and more short of breath.  In the ER diagnosed with severe acute hypoxic respiratory failure due to COVID-19 pneumonia along with severe dehydration and hypotension admitted to the hospital.   Subjective:   Kelly Patel in bed, appears comfortable, denies any headache, no fever, no chest pain or pressure, much improved shortness of breath , no abdominal pain. No focal weakness.  She does report insomnia   Assessment  & Plan :     Sepsis with severe Acute Hypoxic Resp. Failure due to Acute Covid 19 Viral Pneumonitis during the ongoing 2020 Covid 19 Pandemic  - she has severe disease with extensive parenchymal lung disease ongoing for several days to a few weeks prior to admission.  She has been appropriately started on IV steroids, remdesivir and has received Actemra after consent in the ER.  She has shown remarkable improvement and she remains on 2 L oxygen nasal cannula this morning  down from 15 to 20 L on admission.  Continue to advance activity, continue to use I-S and flutter valve  in the daytime for pulmonary toiletry and prone at night.  If she continues to show improvement likely home in the next 1 to 2 days, may require oxygen temporarily at home, 2 L nasal cannula oxygen for home has been ordered.   SpO2: 90 % O2 Flow Rate (L/min): 2  L/min  Recent Labs  Lab 04/18/20 1655 04/18/20 1655 04/18/20 1656 04/19/20 0627 04/20/20 0833 04/21/20 0430 04/22/20 0555 04/23/20 0752  CRP 11.8*   < >  --  8.3* 2.9* 2.1* 0.9 0.8  DDIMER 4.63*   < >  --  3.73* 3.51* 2.45* 2.01* 2.07*  BNP  --   --   --  29.6 325.0* 198.9* 69.5 22.4  PROCALCITON <0.10  --   --  <0.10 <0.10 <0.10  --   --   SARSCOV2NAA  --   --  POSITIVE*  --   --   --   --   --    < > = values in this interval not displayed.    Hepatic Function Latest Ref Rng & Units 04/23/2020 04/22/2020 04/21/2020  Total Protein 6.5 - 8.1 g/dL 6.3(L) 6.2(L) 6.1(L)  Albumin 3.5 - 5.0 g/dL 2.8(L) 2.4(L) 2.4(L)  AST 15 - 41 U/L 22 38 23  ALT 0 - 44 U/L 78(H) 80(H) 63(H)  Alk Phosphatase 38 - 126 U/L 64 59 71  Total Bilirubin 0.3 - 1.2 mg/dL 1.0 0.6 0.9    Mild asymptomatic COVID-19 viral infection related transaminitis.   -Continue to trend, numbers appears to be with no significant change  Severe dehydration with hypotension.  -  LR bolus followed by maintenance.   Elevated D-dimer due to severe inflammation from COVID-19. -   On high prophylactic dose Lovenox and D-dimer is trending down.  Continue this dose until she is in the hospital trend D-dimer, CTA was negative, venous Doppler pending to rule out DVT    Condition - Extremely Guarded  Family Communication  : Will update family later today.  Code Status :  Full  Consults  :  None  Procedures  :    Leg [place]  CTA - no PE, bilateral interstitial infiltrates.  PUD Prophylaxis : None  Disposition Plan  :    Status is: Inpatient  Remains inpatient appropriate because:IV treatments appropriate due to intensity of illness or inability to take  PO   Dispo: The Kelly Patel is from: Home              Anticipated d/c is to: Home              Anticipated d/c date is: 1days              Kelly Patel currently is not medically stable to d/c.  Severe acute hypoxic respiratory failure due to COVID-19 pneumonia, remains on 2 L nasal cannula and IV steroids.   DVT Prophylaxis  :  Lovenox   Lab Results  Component Value Date   PLT 326 04/23/2020    Diet :  Diet Order            Diet regular Room service appropriate? Yes; Fluid consistency: Thin  Diet effective now               Inpatient Medications  Scheduled Meds: . Chlorhexidine Gluconate Cloth  6 each Topical Daily  . enoxaparin (LOVENOX) injection  50 mg Subcutaneous Q12H  . methylPREDNISolone (SOLU-MEDROL) injection  40 mg Intravenous Daily   Continuous Infusions:  PRN Meds:.guaiFENesin-dextromethorphan, [DISCONTINUED] ondansetron **OR** ondansetron (ZOFRAN) IV, zolpidem  Antibiotics  :    Anti-infectives (From admission, onward)   Start     Dose/Rate Route Frequency Ordered Stop   04/19/20 1000  remdesivir 100 mg in sodium chloride 0.9 % 100 mL IVPB     100 mg 200 mL/hr over 30  Minutes Intravenous Daily 04/18/20 1754 04/22/20 1016   04/19/20 0615  cefTRIAXone (ROCEPHIN) 1 g in sodium chloride 0.9 % 100 mL IVPB  Status:  Discontinued     1 g 200 mL/hr over 30 Minutes Intravenous Daily 04/19/20 0610 04/20/20 1059   04/18/20 1800  remdesivir 100 mg in sodium chloride 0.9 % 100 mL IVPB     100 mg 200 mL/hr over 30 Minutes Intravenous Every 30 min 04/18/20 1754 04/18/20 2006        Orrie Schubert M.D on 04/23/2020 at 12:45 PM  To page go to [email or web address] - password [email or web address]  [facility] -  Office  [phone]    See all Orders from today for further details    Objective:   Vitals:   04/22/20 1907 04/23/20 0020 04/23/20 0400 04/23/20 0759  BP: (!) 91/58 (!) 90/55 (!) 87/70 93/62  Pulse: 71 (!) 55 (!) 52 79  Resp: 19 19 18 20   Temp: 97.8 F (36.6 C)  98.3 F (36.8 C) 97.9 F (36.6 C) 98 F (36.7 C)  TempSrc: Oral Oral Oral Oral  SpO2: 96% 98% 96% 90%  Weight:      Height:        Wt Readings from Last 3 Encounters:  04/18/20 70.5 kg  08/24/16 68.9 kg     Intake/Output Summary (Last 24 hours) at 04/23/2020 1245 Last data filed at 04/23/2020 1117 Gross per 24 hour  Intake 360 ml  Output 1750 ml  Net -1390 ml     Physical Exam  Awake Alert, Oriented X 3, No new F.N deficits, Normal affect Symmetrical Chest wall movement, Good air movement bilaterally, CTAB RRR,No Gallops,Rubs or new Murmurs, No Parasternal Heave +ve B.Sounds, Abd Soft, No tenderness, No rebound - guarding or rigidity. No Cyanosis, Clubbing or edema, No new Rash or bruise      Data Review:    CBC Recent Labs  Lab 04/19/20 0627 04/20/20 0833 04/21/20 0430 04/22/20 0555 04/23/20 0752  WBC 2.9* 8.6 7.1 6.9 7.2  HGB 10.6* 12.5 12.8 12.8 14.0  HCT 32.7* 38.3 39.0 38.6 42.8  PLT 275 412* 382 329 326  MCV 86.1 86.1 84.4 84.6 83.9  MCH 27.9 28.1 27.7 28.1 27.5  MCHC 32.4 32.6 32.8 33.2 32.7  RDW 13.3 13.3 13.0 13.0 13.1  LYMPHSABS 0.5* 0.8 0.7 1.2 1.8  MONOABS 0.1 0.3 0.3 0.6 0.5  EOSABS 0.0 0.0 0.0 0.0 0.0  BASOSABS 0.0 0.0 0.0 0.0 0.0    Chemistries  Recent Labs  Lab 04/19/20 0627 04/20/20 0833 04/21/20 0430 04/22/20 0555 04/23/20 0752  NA 138 137 136 136 136  K 4.2 4.7 4.3 4.1 4.3  CL 106 103 100 100 96*  CO2 22 20* 26 28 29   GLUCOSE 181* 185* 281* 198* 139*  BUN 17 14 12 10 13   CREATININE 0.46 0.52 0.60 0.65 0.65  CALCIUM 7.5* 8.5* 8.4* 8.4* 8.7*  AST 39 31 23 38 22  ALT 76* 70* 63* 80* 78*  ALKPHOS 68 70 71 59 64  BILITOT 0.8 1.3* 0.9 0.6 1.0  MG 2.1 2.3 2.2 2.3 2.2     ------------------------------------------------------------------------------------------------------------------ No results for input(s): CHOL, HDL, LDLCALC, TRIG, CHOLHDL, LDLDIRECT in the last 72 hours.  No results found for:  HGBA1C ------------------------------------------------------------------------------------------------------------------ No results for input(s): TSH, T4TOTAL, T3FREE, THYROIDAB in the last 72 hours.  Invalid input(s): FREET3  Cardiac Enzymes No results for input(s): CKMB, TROPONINI, MYOGLOBIN in the last 168 hours.  Invalid input(s): CK ------------------------------------------------------------------------------------------------------------------  Component Value Date/Time   BNP 22.4 04/23/2020 4650    Micro Results Recent Results (from the past 240 hour(s))  SARS Coronavirus 2 by RT PCR (hospital order, performed in [facility] hospital lab) Nasopharyngeal Nasopharyngeal Swab     Status: Abnormal   Collection Time: 04/18/20  4:56 PM   Specimen: Nasopharyngeal Swab  Result Value Ref Range Status   SARS Coronavirus 2 POSITIVE (A) NEGATIVE Final    Comment: RESULT CALLED TO, READ BACK BY AND VERIFIED WITH: CALLED TO S.COBLE AT 1838 ON [id number] BY SROY (NOTE) SARS-CoV-2 target nucleic acids are DETECTED SARS-CoV-2 RNA is generally detectable in upper respiratory specimens  during the acute phase of infection.  Positive results are indicative  of the presence of the identified virus, but do not rule out bacterial infection or co-infection with other pathogens not detected by the test.  Clinical correlation with Kelly Patel history and  other diagnostic information is necessary to determine Kelly Patel infection status.  The expected result is negative. Fact Sheet for Patients:   StrictlyIdeas.no  Fact Sheet for Healthcare Providers:   BankingDealers.co.za   This test is not yet approved or cleared by the [place] FDA and  has been authorized for detection and/or diagnosis of SARS-CoV-2 by FDA under an Emergency Use Authorization (EUA).  This EUA will remain in effect (meaning this t est can be used) for the duration of  the  COVID-19 declaration under Section 564(b)(1) of the Act, 21 U.S.C. section 360-bbb-3(b)(1), unless the authorization is terminated or revoked sooner. Performed at [facility], [address] [place], [place] [address]   Blood Culture (routine x 2)     Status: None   Collection Time: 04/18/20  5:00 PM   Specimen: BLOOD RIGHT ARM  Result Value Ref Range Status   Specimen Description   Final    BLOOD RIGHT ARM Performed at [facility], [address] [place], [place] [address]    Special Requests   Final    BOTTLES DRAWN AEROBIC AND ANAEROBIC Blood Culture adequate volume Performed at [facility], [address] [place], [place] [address]    Culture   Final    NO GROWTH 5 DAYS Performed at [facility], [place] [address], [place], [place] [address]    Report Status 04/23/2020 FINAL  Final  Blood Culture (routine x 2)     Status: None   Collection Time: 04/18/20  5:10 PM   Specimen: Left Antecubital; Blood  Result Value Ref Range Status   Specimen Description   Final    LEFT ANTECUBITAL Performed at [facility], [address] [place], [place] [address]    Special Requests   Final    BOTTLES DRAWN AEROBIC AND ANAEROBIC Blood Culture adequate volume Performed at [facility], [address] [place], [place] [address]    Culture   Final    NO GROWTH 5 DAYS Performed at [facility], [address] [address], [place], [place] [address]    Report Status 04/23/2020 FINAL  Final    Radiology Reports CT ANGIO CHEST PE W OR WO CONTRAST  Result Date: 04/19/2020 CLINICAL DATA:  Inpatient. Dyspnea. Intermittent cough. Desaturation. COVID-19 positive. EXAM: CT ANGIOGRAPHY CHEST WITH CONTRAST TECHNIQUE: Multidetector CT imaging of the chest was performed using the standard protocol during bolus administration of intravenous contrast. Multiplanar CT image reconstructions and MIPs were obtained to evaluate the vascular anatomy.  CONTRAST:  117m OMNIPAQUE IOHEXOL 350 MG/ML SOLN COMPARISON:  Chest radiograph from one day prior. FINDINGS: Cardiovascular: The study is high quality for the evaluation of pulmonary embolism. There are no filling defects in the central, lobar, segmental or subsegmental pulmonary artery branches to suggest acute pulmonary embolism. Normal course and caliber of the thoracic aorta. Top-normal caliber main pulmonary artery (3.1 cm diameter). Normal heart size. Trace pericardial effusion/thickening. Mediastinum/Nodes: No discrete thyroid nodules. Unremarkable esophagus. No pathologically enlarged axillary, mediastinal or hilar lymph nodes. Lungs/Pleura: No pneumothorax. No pleural effusion. Extensive patchy peribronchovascular and peripheral consolidation throughout both lungs involving all lung lobes, most prominent in the lower lobes with associated air bronchograms. No discrete lung masses or significant pulmonary nodules. Upper abdomen: No acute abnormality. Musculoskeletal:  No aggressive appearing focal osseous lesions. Review of the MIP images confirms the above findings. IMPRESSION: 1. No pulmonary embolism. 2. Extensive patchy consolidation throughout both lungs with associated air bronchograms, compatible with severe multilobar bronchopneumonia due to COVID-19. 3. Trace pericardial effusion/thickening. Electronically Signed   By: JIlona SorrelM.D.   On: 04/19/2020 07:00   DG Chest Port 1 View  Result Date: 04/20/2020 CLINICAL DATA:  Dyspnea, COVID-19 positive EXAM: PORTABLE CHEST 1 VIEW COMPARISON:  04/18/2020 chest radiograph. FINDINGS: Stable cardiomediastinal silhouette with normal heart size. No pneumothorax. No pleural effusion. Extensive patchy opacities throughout both lungs, mildly improved on the right. IMPRESSION: Extensive patchy opacities throughout both lungs, mildly improved on the right, compatible with COVID-19 pneumonia. Electronically Signed   By: JIlona SorrelM.D.   On: 04/20/2020 07:47    DG Chest Port 1 View  Result Date: 04/18/2020 CLINICAL DATA:  COVID-19.  Shortness of breath.  Hypoxemia. EXAM: PORTABLE CHEST 1 VIEW COMPARISON:  None. FINDINGS: The Kelly Patel has extensive bilateral pulmonary infiltrates, more severe on the right than the left. No effusions. Heart size and vascularity are normal. Bones are normal. IMPRESSION: Extensive bilateral pulmonary infiltrates, more severe on the right than the left, consistent with COVID pneumonia. Electronically Signed   By: JLorriane ShireM.D.   On: 04/18/2020 18:21

## 2020-04-23 NOTE — TOC Initial Note (Addendum)
Transition of Care [facility]) - Initial/Assessment Note    Patient Details  Name: Kelly Patel MRN: [id number] Date of Birth: [birth year]  Transition [facility]) CM/SW Contact:    Cherylann Parr, RN Phone Number: 04/23/2020, 8:11 AM  Clinical Narrative:   PTA independent from home with her children and husband.  Pt confirms she does not have a PCP and she is interested in a [facility] appt - first appt arranged. Pt is in agreement for home oxygen as ordered - [employer] chosen - [facility] accepts               Update:  CM informed by [employer] that pts insurance information in Epic is not correct.  [employer] has reached out to both pt and husband in an attempt to secure correct insurance information required to arrange home oxygen  Update 1700:  [employer] has the correct insurance info and pt will receive the POC prior to discharge.  CM confirmed address and contact info with pt and informed pt that [employer] will also reach out to her to schedule home oxygen equipment delivery   Expected Discharge Plan: Home/Self Care     Patient Goals and CMS Choice Patient states their goals for this hospitalization and ongoing recovery are:: Pt did not specify a goal CMS Medicare.gov Compare Post Acute Care list provided to:: Patient Choice offered to / list presented to : Patient  Expected Discharge Plan and Services Expected Discharge Plan: Home/Self Care     Post Acute Care Choice: Durable Medical Equipment Living arrangements for the past 2 months: Single Family Home                 DME Arranged: Oxygen DME Agency: [facility] Date DME Agency Contacted: 04/22/20 Time DME Agency Contacted: 1500 Representative spoke with at DME Agency: Zack            Prior Living Arrangements/Services Living arrangements for the past 2 months: Single Family Home Lives with:: Spouse Patient language and need for interpreter reviewed:: Yes        Need for Family Participation in Patient Care: No (Comment) Care giver  support system in place?: Yes (comment)   Criminal Activity/Legal Involvement Pertinent to Current Situation/Hospitalization: No - Comment as needed  Activities of Daily Living      Permission Sought/Granted   Permission granted to share information with : Yes, Verbal Permission Granted              Emotional Assessment   Attitude/Demeanor/Rapport: Gracious, Charismatic Affect (typically observed): Accepting Orientation: : Oriented to Self, Oriented to Place, Oriented to  Time, Oriented to Situation   Psych Involvement: No (comment)  Admission diagnosis:  Acute respiratory failure with hypoxia (HCC) [J96.01] Acute hypoxemic respiratory failure due to COVID-19 (HCC) [U07.1, J96.01] Pneumonia due to COVID-19 virus [U07.1, J12.82] Acute respiratory disease due to COVID-19 virus [U07.1, J06.9] Patient Active Problem List   Diagnosis Date Noted  . Acute respiratory disease due to COVID-19 virus 04/19/2020  . Acute hypoxemic respiratory failure due to COVID-19 (HCC) 04/18/2020   PCP:  Patient, No Pcp Per Pharmacy:   [id number] - [place], [place] - [address] AT [facility] [address], [address] [place] [address] Phone: [phone] Fax: [phone]     Social Determinants of Health (SDOH) Interventions    Readmission Risk Interventions No flowsheet data found.

## 2020-04-24 DIAGNOSIS — J1282 Pneumonia due to coronavirus disease 2019: Secondary | ICD-10-CM

## 2020-04-24 LAB — CBC WITH DIFFERENTIAL/PLATELET
Abs Immature Granulocytes: 0.22 10*3/uL — ABNORMAL HIGH (ref 0.00–0.07)
Basophils Absolute: 0 10*3/uL (ref 0.0–0.1)
Basophils Relative: 0 %
Eosinophils Absolute: 0 10*3/uL (ref 0.0–0.5)
Eosinophils Relative: 1 %
HCT: 42.3 % (ref 36.0–46.0)
Hemoglobin: 14 g/dL (ref 12.0–15.0)
Immature Granulocytes: 3 %
Lymphocytes Relative: 17 %
Lymphs Abs: 1.4 10*3/uL (ref 0.7–4.0)
MCH: 27.6 pg (ref 26.0–34.0)
MCHC: 33.1 g/dL (ref 30.0–36.0)
MCV: 83.4 fL (ref 80.0–100.0)
Monocytes Absolute: 0.6 10*3/uL (ref 0.1–1.0)
Monocytes Relative: 7 %
Neutro Abs: 6.1 10*3/uL (ref 1.7–7.7)
Neutrophils Relative %: 72 %
Platelets: 312 10*3/uL (ref 150–400)
RBC: 5.07 MIL/uL (ref 3.87–5.11)
RDW: 13.1 % (ref 11.5–15.5)
WBC: 8.4 10*3/uL (ref 4.0–10.5)
nRBC: 0 % (ref 0.0–0.2)

## 2020-04-24 LAB — COMPREHENSIVE METABOLIC PANEL
ALT: 63 U/L — ABNORMAL HIGH (ref 0–44)
AST: 14 U/L — ABNORMAL LOW (ref 15–41)
Albumin: 2.8 g/dL — ABNORMAL LOW (ref 3.5–5.0)
Alkaline Phosphatase: 65 U/L (ref 38–126)
Anion gap: 9 (ref 5–15)
BUN: 13 mg/dL (ref 6–20)
CO2: 29 mmol/L (ref 22–32)
Calcium: 8.6 mg/dL — ABNORMAL LOW (ref 8.9–10.3)
Chloride: 97 mmol/L — ABNORMAL LOW (ref 98–111)
Creatinine, Ser: 0.66 mg/dL (ref 0.44–1.00)
GFR calc Af Amer: >60 mL/min (ref >60)
GFR calc non Af Amer: >60 mL/min (ref >60)
Glucose, Bld: 185 mg/dL — ABNORMAL HIGH (ref 70–99)
Potassium: 4 mmol/L (ref 3.5–5.1)
Sodium: 135 mmol/L (ref 135–145)
Total Bilirubin: 1.1 mg/dL (ref 0.3–1.2)
Total Protein: 6.2 g/dL — ABNORMAL LOW (ref 6.5–8.1)

## 2020-04-24 LAB — BRAIN NATRIURETIC PEPTIDE: B Natriuretic Peptide: 17.7 pg/mL (ref 0.0–100.0)

## 2020-04-24 LAB — MAGNESIUM: Magnesium: 2.2 mg/dL (ref 1.7–2.4)

## 2020-04-24 LAB — C-REACTIVE PROTEIN: CRP: 0.7 mg/dL (ref <1.0)

## 2020-04-24 LAB — D-DIMER, QUANTITATIVE: D-Dimer, Quant: 1.63 ug/mL-FEU — ABNORMAL HIGH (ref 0.00–0.50)

## 2020-04-24 MED ORDER — DEXAMETHASONE 6 MG PO TABS: 6.0000 mg | ORAL_TABLET | Freq: Every day | ORAL | 0 refills | Status: DC

## 2020-04-24 NOTE — Progress Notes (Signed)
SATURATION QUALIFICATIONS: (This note is used to comply with regulatory documentation for home oxygen)  Patient Saturations on Room Air at Rest = 90%  Patient Saturations on Room Air while Ambulating = 86%  Patient Saturations on 2 Liters of oxygen while Ambulating = 91%  Please briefly explain why patient needs home oxygen: 

## 2020-04-24 NOTE — TOC Transition Note (Signed)
Transition of Care [facility]) - CM/SW Discharge Note   Patient Details  Name: Sumaya Riedesel MRN: [id number] Date of Birth: Mar 24, 1986  Transition of Care [facility]) CM/SW Contact:  Cherylann Parr, RN Phone Number: 04/24/2020, 10:51 AM   Clinical Narrative:   Pt deemed stable for discharge home today.  Once pt receives POC oxygen tank from [employer] pt can transport home.    Discharge order signed - no outstanding TOC needs - CM signing off          Patient Goals and CMS Choice Patient states their goals for this hospitalization and ongoing recovery are:: Pt did not specify a goal CMS Medicare.gov Compare Post Acute Care list provided to:: Patient Choice offered to / list presented to : Patient  Discharge Placement                       Discharge Plan and Services     Post Acute Care Choice: Durable Medical Equipment          DME Arranged: Oxygen DME Agency: [facility] Date DME Agency Contacted: 04/22/20 Time DME Agency Contacted: 1500 Representative spoke with at DME Agency: Zack            Social Determinants of Health (SDOH) Interventions     Readmission Risk Interventions No flowsheet data found.

## 2020-04-24 NOTE — Discharge Instructions (Signed)
Person Under Monitoring Name: Kelly Patel  Location: [address] [place] [address]   Infection Prevention Recommendations for Individuals Confirmed to have, or Being Evaluated for, 2019 Novel Coronavirus (COVID-19) Infection Who Receive Care at Home  Individuals who are confirmed to have, or are being evaluated for, COVID-19 should follow the prevention steps below until a healthcare provider or local or state health department says they can return to normal activities.  Stay home except to get medical care You should restrict activities outside your home, except for getting medical care. Do not go to work, school, or public areas, and do not use public transportation or taxis.  Call ahead before visiting your doctor Before your medical appointment, call the healthcare provider and tell them that you have, or are being evaluated for, COVID-19 infection. This will help the healthcare provider's office take steps to keep other people from getting infected. Ask your healthcare provider to call the local or state health department.  Monitor your symptoms Seek prompt medical attention if your illness is worsening (e.g., difficulty breathing). Before going to your medical appointment, call the healthcare provider and tell them that you have, or are being evaluated for, COVID-19 infection. Ask your healthcare provider to call the local or state health department.  Wear a facemask You should wear a facemask that covers your nose and mouth when you are in the same room with other people and when you visit a healthcare provider. People who live with or visit you should also wear a facemask while they are in the same room with you.  Separate yourself from other people in your home As much as possible, you should stay in a different room from other people in your home. Also, you should use a separate bathroom, if available.  Avoid sharing household items You should not share  dishes, drinking glasses, cups, eating utensils, towels, bedding, or other items with other people in your home. After using these items, you should wash them thoroughly with soap and water.  Cover your coughs and sneezes Cover your mouth and nose with a tissue when you cough or sneeze, or you can cough or sneeze into your sleeve. Throw used tissues in a lined trash can, and immediately wash your hands with soap and water for at least 20 seconds or use an alcohol-based hand rub.  Wash your [employer] your hands often and thoroughly with soap and water for at least 20 seconds. You can use an alcohol-based hand sanitizer if soap and water are not available and if your hands are not visibly dirty. Avoid touching your eyes, nose, and mouth with unwashed hands.   Prevention Steps for Caregivers and Household Members of Individuals Confirmed to have, or Being Evaluated for, COVID-19 Infection Being Cared for in the Home  If you live with, or provide care at home for, a person confirmed to have, or being evaluated for, COVID-19 infection please follow these guidelines to prevent infection:  Follow healthcare provider's instructions Make sure that you understand and can help the patient follow any healthcare provider instructions for all care.  Provide for the patient's basic needs You should help the patient with basic needs in the home and provide support for getting groceries, prescriptions, and other personal needs.  Monitor the patient's symptoms If they are getting sicker, call his or her medical provider and tell them that the patient has, or is being evaluated for, COVID-19 infection. This will help the healthcare provider's  office take steps to keep other people from getting infected. Ask the healthcare provider to call the local or state health department.  Limit the number of people who have contact with the patient  If possible, have only one caregiver for the patient.  Other  household members should stay in another home or place of residence. If this is not possible, they should stay  in another room, or be separated from the patient as much as possible. Use a separate bathroom, if available.  Restrict visitors who do not have an essential need to be in the home.  Keep older adults, very young children, and other sick people away from the patient Keep older adults, very young children, and those who have compromised immune systems or chronic health conditions away from the patient. This includes people with chronic heart, lung, or kidney conditions, diabetes, and cancer.  Ensure good ventilation Make sure that shared spaces in the home have good air flow, such as from an air conditioner or an opened window, weather permitting.  Wash your hands often  Wash your hands often and thoroughly with soap and water for at least 20 seconds. You can use an alcohol based hand sanitizer if soap and water are not available and if your hands are not visibly dirty.  Avoid touching your eyes, nose, and mouth with unwashed hands.  Use disposable paper towels to dry your hands. If not available, use dedicated cloth towels and replace them when they become wet.  Wear a facemask and gloves  Wear a disposable facemask at all times in the room and gloves when you touch or have contact with the patient's blood, body fluids, and/or secretions or excretions, such as sweat, saliva, sputum, nasal mucus, vomit, urine, or feces.  Ensure the mask fits over your nose and mouth tightly, and do not touch it during use.  Throw out disposable facemasks and gloves after using them. Do not reuse.  Wash your hands immediately after removing your facemask and gloves.  If your personal clothing becomes contaminated, carefully remove clothing and launder. Wash your hands after handling contaminated clothing.  Place all used disposable facemasks, gloves, and other waste in a lined container before  disposing them with other household waste.  Remove gloves and wash your hands immediately after handling these items.  Do not share dishes, glasses, or other household items with the patient  Avoid sharing household items. You should not share dishes, drinking glasses, cups, eating utensils, towels, bedding, or other items with a patient who is confirmed to have, or being evaluated for, COVID-19 infection.  After the person uses these items, you should wash them thoroughly with soap and water.  Wash laundry thoroughly  Immediately remove and wash clothes or bedding that have blood, body fluids, and/or secretions or excretions, such as sweat, saliva, sputum, nasal mucus, vomit, urine, or feces, on them.  Wear gloves when handling laundry from the patient.  Read and follow directions on labels of laundry or clothing items and detergent. In general, wash and dry with the warmest temperatures recommended on the label.  Clean all areas the individual has used often  Clean all touchable surfaces, such as counters, tabletops, doorknobs, bathroom fixtures, toilets, phones, keyboards, tablets, and bedside tables, every day. Also, clean any surfaces that may have blood, body fluids, and/or secretions or excretions on them.  Wear gloves when cleaning surfaces the patient has come in contact with.  Use a diluted bleach solution (e.g., dilute bleach with 1  part bleach and 10 parts water) or a household disinfectant with a label that says EPA-registered for coronaviruses. To make a bleach solution at home, add 1 tablespoon of bleach to 1 quart (4 cups) of water. For a larger supply, add  cup of bleach to 1 gallon (16 cups) of water.  Read labels of cleaning products and follow recommendations provided on product labels. Labels contain instructions for safe and effective use of the cleaning product including precautions you should take when applying the product, such as wearing gloves or eye protection  and making sure you have good ventilation during use of the product.  Remove gloves and wash hands immediately after cleaning.  Monitor yourself for signs and symptoms of illness Caregivers and household members are considered close contacts, should monitor their health, and will be asked to limit movement outside of the home to the extent possible. Follow the monitoring steps for close contacts listed on the symptom monitoring form.   ? If you have additional questions, contact your local health department or call the epidemiologist on call at [phone] (available 24/7). ? This guidance is subject to change. For the most up-to-date guidance from [facility], please refer to their website: [email or web address]

## 2020-04-24 NOTE — Progress Notes (Signed)
D/c education provided to husband and pt. Oxygen has been delivered. Awaiting for husband to pick pt up.

## 2020-04-24 NOTE — Discharge Summary (Signed)
Kelly Patel, is a 34 y.o. female  DOB 08-24-1986  MRN [id number].  Admission date:  04/18/2020  Admitting Physician  Eduard Clos, MD  Discharge Date:  04/24/2020   Primary MD  Patient, No Pcp Per  Recommendations for primary care physician for things to follow:  -Please check CBC, CMP during next visit.    Admission Diagnosis  Acute respiratory failure with hypoxia (HCC) [J96.01] Acute hypoxemic respiratory failure due to COVID-19 (HCC) [U07.1, J96.01] Pneumonia due to COVID-19 virus [U07.1, J12.82] Acute respiratory disease due to COVID-19 virus [U07.1, J06.9]   Discharge Diagnosis  Acute respiratory failure with hypoxia (HCC) [J96.01] Acute hypoxemic respiratory failure due to COVID-19 (HCC) [U07.1, J96.01] Pneumonia due to COVID-19 virus [U07.1, J12.82] Acute respiratory disease due to COVID-19 virus [U07.1, J06.9]   Principal Problem:   Acute hypoxemic respiratory failure due to COVID-19 [facility]) Active Problems:   Acute respiratory disease due to COVID-19 virus      Past Medical History:  Diagnosis Date  . Medical history non-contributory     Past Surgical History:  Procedure Laterality Date  . CESAREAN SECTION         History of present illness and  Hospital Course:     Kindly see H&P for history of present illness and admission details, please review complete Labs, Consult reports and Test reports for all details in brief  HPI  from the history and physical done on the day of admission 04/18/2020  HPI: Kelly Patel is a 34 y.o. female with no significant past medical history presents to the ER at [facility] with worsening shortness of breath.  Patient states she was diagnosed with COVID-19 infection about 2 weeks ago when patient had headache fever chills.  At the time most of the symptoms were upper respiratory tract symptoms.  Over the last 4 days patient  started developing shortness of breath and productive cough.  No chest pain has had some diarrhea and vomiting.  ED Course: In the ER patient was hypoxic requiring almost nonrebreather initially then weaned off to about 15 L oxygen.  Chest x-ray shows bilateral infiltrates Covid test was positive patient was afebrile.  Labs show CRP of 11.8 procalcitonin negative lactic acid was 2 improved with fluids to 1.4 EKG shows normal sinus rhythm with T wave inversions D-dimer was 4.63.  Patient was started on remdesivir Decadron admitted for acute respiratory failure with hypoxia secondary to Covid infection.   Hospital Course   Sepsis with severe Acute Hypoxic Resp. Failure due to Acute Covid 19 Viral Pneumonitis during the ongoing 2020 Covid 19 Pandemic : -Patient did present with severe COVID-19 infection, with significant oxygen requirement, initially 15 to 20L on admission, she was treated with IV remdesivir, she was treated with IV steroids during hospital stay, as well she did receive IV Actemra, with significant improvement of her symptoms and hypoxia, will finish another 3 days of oral Decadron for total of 10 days treatment . -Was encouraged to take her incentive spirometry and flutter  valve with her at home and keep using them . -He remains with mild hypoxia, she will be discharged home with home oxygen . -Sepsis has resolved  Mild asymptomatic COVID-19 viral infection related transaminitis.   -Improving  Severe dehydration with hypotension.  -  Resolved with hydration, she does appear to be having baseline soft  blood pressure, but she is asymptomatic.   Elevated D-dimer due to severe inflammation from COVID-19. -   On high prophylactic dose Lovenox and D-dimer is trending down.  Continue this dose until she is in the hospital trend D-dimer, CTA was negative,  venous Doppler negative for DVT as well    Discharge Condition:  stable   Follow UP  Follow-up Information     [facility] Follow up.   Why: home oxygen       [facility] Follow up.   Why: the appt for 7/21 is a televist appt.  Please do not go to the office location - the office will call you for a phone visit            Discharge Instructions  and  Discharge Medications     Discharge Instructions    Discharge instructions   Complete by: As directed    Follow with Primary MD Patient.  Get CBC, CMP, 2 view Chest X ray checked  by Primary MD next visit.    Activity: As tolerated with Full fall precautions use walker/cane & assistance as needed   Disposition Home    Diet: Regular diet   On your next visit with your primary care physician please Get Medicines reviewed and adjusted.   Please request your Prim.MD to go over all Hospital Tests and Procedure/Radiological results at the follow up, please get all Hospital records sent to your Prim MD by signing hospital release before you go home.   If you experience worsening of your admission symptoms, develop shortness of breath, life threatening emergency, suicidal or homicidal thoughts you must seek medical attention immediately by calling 911 or calling your MD immediately  if symptoms less severe.  You Must read complete instructions/literature along with all the possible adverse reactions/side effects for all the Medicines you take and that have been prescribed to you. Take any new Medicines after you have completely understood and accpet all the possible adverse reactions/side effects.   Do not drive, operating heavy machinery, perform activities at heights, swimming or participation in water activities or provide baby sitting services if your were admitted for syncope or siezures until you have seen by Primary MD or a Neurologist and advised to do so again.  Do not drive when taking Pain medications.    Do not take more than prescribed Pain, Sleep and Anxiety Medications  Special Instructions: If you have smoked  or chewed Tobacco  in the last 2 yrs please stop smoking, stop any regular Alcohol  and or any Recreational drug use.  Wear Seat belts while driving.   Please note  You were cared for by a hospitalist during your hospital stay. If you have any questions about your discharge medications or the care you received while you were in the hospital after you are discharged, you can call the unit and asked to speak with the hospitalist on call if the hospitalist that took care of you is not available. Once you are discharged, your primary care physician will handle any further medical issues. Please note that NO REFILLS for any discharge medications will be authorized once you  are discharged, as it is imperative that you return to your primary care physician (or establish a relationship with a primary care physician if you do not have one) for your aftercare needs so that they can reassess your need for medications and monitor your lab values.   Increase activity slowly   Complete by: As directed      Allergies as of 04/24/2020   No Known Allergies     Medication List    STOP taking these medications   nitrofurantoin (macrocrystal-monohydrate) 100 MG capsule Commonly known as: MACROBID   sulfamethoxazole-trimethoprim 800-160 MG tablet Commonly known as: BACTRIM DS     TAKE these medications   benzonatate 100 MG capsule Commonly known as: TESSALON Take 100 mg by mouth 3 (three) times daily.   dexamethasone 6 MG tablet Commonly known as: DECADRON Take 1 tablet (6 mg total) by mouth daily. Start taking on: April 25, 2020   ondansetron 4 MG tablet Commonly known as: ZOFRAN Take 4 mg by mouth daily as needed for nausea/vomiting.            Durable Medical Equipment  (From admission, onward)         Start     Ordered   04/22/20 1036  For home use only DME oxygen  Once    Question Answer Comment  Length of Need 6 Months   Mode or (Route) Nasal cannula   Liters per Minute 2     Frequency Continuous (stationary and portable oxygen unit needed)   Oxygen delivery system Gas      04/22/20 1035            Diet and Activity recommendation: See Discharge Instructions above   Consults obtained -  none   Major procedures and Radiology Reports - PLEASE review detailed and final reports for all details, in brief -     CT ANGIO CHEST PE W OR WO CONTRAST  Result Date: 04/19/2020 CLINICAL DATA:  Inpatient. Dyspnea. Intermittent cough. Desaturation. COVID-19 positive. EXAM: CT ANGIOGRAPHY CHEST WITH CONTRAST TECHNIQUE: Multidetector CT imaging of the chest was performed using the standard protocol during bolus administration of intravenous contrast. Multiplanar CT image reconstructions and MIPs were obtained to evaluate the vascular anatomy. CONTRAST:  165mL OMNIPAQUE IOHEXOL 350 MG/ML SOLN COMPARISON:  Chest radiograph from one day prior. FINDINGS: Cardiovascular: The study is high quality for the evaluation of pulmonary embolism. There are no filling defects in the central, lobar, segmental or subsegmental pulmonary artery branches to suggest acute pulmonary embolism. Normal course and caliber of the thoracic aorta. Top-normal caliber main pulmonary artery (3.1 cm diameter). Normal heart size. Trace pericardial effusion/thickening. Mediastinum/Nodes: No discrete thyroid nodules. Unremarkable esophagus. No pathologically enlarged axillary, mediastinal or hilar lymph nodes. Lungs/Pleura: No pneumothorax. No pleural effusion. Extensive patchy peribronchovascular and peripheral consolidation throughout both lungs involving all lung lobes, most prominent in the lower lobes with associated air bronchograms. No discrete lung masses or significant pulmonary nodules. Upper abdomen: No acute abnormality. Musculoskeletal:  No aggressive appearing focal osseous lesions. Review of the MIP images confirms the above findings. IMPRESSION: 1. No pulmonary embolism. 2. Extensive patchy  consolidation throughout both lungs with associated air bronchograms, compatible with severe multilobar bronchopneumonia due to COVID-19. 3. Trace pericardial effusion/thickening. Electronically Signed   By: Ilona Sorrel M.D.   On: 04/19/2020 07:00   DG Chest Port 1 View  Result Date: 04/20/2020 CLINICAL DATA:  Dyspnea, COVID-19 positive EXAM: PORTABLE CHEST 1 VIEW COMPARISON:  04/18/2020 chest radiograph. FINDINGS:  Stable cardiomediastinal silhouette with normal heart size. No pneumothorax. No pleural effusion. Extensive patchy opacities throughout both lungs, mildly improved on the right. IMPRESSION: Extensive patchy opacities throughout both lungs, mildly improved on the right, compatible with COVID-19 pneumonia. Electronically Signed   By: Delbert Phenix M.D.   On: 04/20/2020 07:47   DG Chest Port 1 View  Result Date: 04/18/2020 CLINICAL DATA:  COVID-19.  Shortness of breath.  Hypoxemia. EXAM: PORTABLE CHEST 1 VIEW COMPARISON:  None. FINDINGS: The patient has extensive bilateral pulmonary infiltrates, more severe on the right than the left. No effusions. Heart size and vascularity are normal. Bones are normal. IMPRESSION: Extensive bilateral pulmonary infiltrates, more severe on the right than the left, consistent with COVID pneumonia. Electronically Signed   By: Francene Boyers M.D.   On: 04/18/2020 18:21   VAS [place] LOWER EXTREMITY VENOUS (DVT)  Result Date: 04/23/2020  Lower Venous DVTStudy Indications: D dimer.  Comparison Study: none Performing Technologist: Jeb Levering RDMS, RVT  Examination Guidelines: A complete evaluation includes B-mode imaging, spectral Doppler, color Doppler, and power Doppler as needed of all accessible portions of each vessel. Bilateral testing is considered an integral part of a complete examination. Limited examinations for reoccurring indications may be performed as noted. The reflux portion of the exam is performed with the patient in reverse Trendelenburg.   +---------+---------------+---------+-----------+----------+--------------+ RIGHT    CompressibilityPhasicitySpontaneityPropertiesThrombus Aging +---------+---------------+---------+-----------+----------+--------------+ CFV      Full           Yes      Yes                                 +---------+---------------+---------+-----------+----------+--------------+ SFJ      Full                                                        +---------+---------------+---------+-----------+----------+--------------+ FV Prox  Full                                                        +---------+---------------+---------+-----------+----------+--------------+ FV Mid   Full                                                        +---------+---------------+---------+-----------+----------+--------------+ FV DistalFull                                                        +---------+---------------+---------+-----------+----------+--------------+ PFV      Full                                                        +---------+---------------+---------+-----------+----------+--------------+  POP      Full           Yes      Yes                                 +---------+---------------+---------+-----------+----------+--------------+ PTV      Full                                                        +---------+---------------+---------+-----------+----------+--------------+ PERO     Full                                                        +---------+---------------+---------+-----------+----------+--------------+   +---------+---------------+---------+-----------+----------+--------------+ LEFT     CompressibilityPhasicitySpontaneityPropertiesThrombus Aging +---------+---------------+---------+-----------+----------+--------------+ CFV      Full           Yes      Yes                                  +---------+---------------+---------+-----------+----------+--------------+ SFJ      Full                                                        +---------+---------------+---------+-----------+----------+--------------+ FV Prox  Full                                                        +---------+---------------+---------+-----------+----------+--------------+ FV Mid   Full                                                        +---------+---------------+---------+-----------+----------+--------------+ FV DistalFull                                                        +---------+---------------+---------+-----------+----------+--------------+ PFV      Full                                                        +---------+---------------+---------+-----------+----------+--------------+ POP      Full           Yes      Yes                                 +---------+---------------+---------+-----------+----------+--------------+  PTV      Full                                                        +---------+---------------+---------+-----------+----------+--------------+ PERO     Full                                                        +---------+---------------+---------+-----------+----------+--------------+     Summary: BILATERAL: - No evidence of deep vein thrombosis seen in the lower extremities, bilaterally. -No evidence of popliteal cyst, bilaterally.   *See table(s) above for measurements and observations. Electronically signed by Gretta Began MD on 04/23/2020 at 5:18:55 PM.    Final     Micro Results     Recent Results (from the past 240 hour(s))  SARS Coronavirus 2 by RT PCR (hospital order, performed in [facility] hospital lab) Nasopharyngeal Nasopharyngeal Swab     Status: Abnormal   Collection Time: 04/18/20  4:56 PM   Specimen: Nasopharyngeal Swab  Result Value Ref Range Status   SARS Coronavirus 2 POSITIVE (A) NEGATIVE Final     Comment: RESULT CALLED TO, READ BACK BY AND VERIFIED WITH: CALLED TO S.COBLE AT 1838 ON [id number] BY SROY (NOTE) SARS-CoV-2 target nucleic acids are DETECTED SARS-CoV-2 RNA is generally detectable in upper respiratory specimens  during the acute phase of infection.  Positive results are indicative  of the presence of the identified virus, but do not rule out bacterial infection or co-infection with other pathogens not detected by the test.  Clinical correlation with patient history and  other diagnostic information is necessary to determine patient infection status.  The expected result is negative. Fact Sheet for Patients:   BoilerBrush.com.cy  Fact Sheet for Healthcare Providers:   https://pope.com/   This test is not yet approved or cleared by the [place] FDA and  has been authorized for detection and/or diagnosis of SARS-CoV-2 by FDA under an Emergency Use Authorization (EUA).  This EUA will remain in effect (meaning this t est can be used) for the duration of  the COVID-19 declaration under Section 564(b)(1) of the Act, 21 U.S.C. section 360-bbb-3(b)(1), unless the authorization is terminated or revoked sooner. Performed at [facility], [address] [place], [place] [address]   Blood Culture (routine x 2)     Status: None   Collection Time: 04/18/20  5:00 PM   Specimen: BLOOD RIGHT ARM  Result Value Ref Range Status   Specimen Description   Final    BLOOD RIGHT ARM Performed at [facility], [address] [place], [place] [address]    Special Requests   Final    BOTTLES DRAWN AEROBIC AND ANAEROBIC Blood Culture adequate volume Performed at [facility], [address] [place], [place] [address]    Culture   Final    NO GROWTH 5 DAYS Performed at [facility], [address] [place], [place] [address]    Report Status 04/23/2020 FINAL  Final  Blood Culture (routine x 2)      Status: None   Collection Time: 04/18/20  5:10 PM  Specimen: Left Antecubital; Blood  Result Value Ref Range Status   Specimen Description   Final    LEFT ANTECUBITAL Performed at [facility], [address] [place], [place] [address]    Special Requests   Final    BOTTLES DRAWN AEROBIC AND ANAEROBIC Blood Culture adequate volume Performed at [facility], [address] [place], [place] [address]    Culture   Final    NO GROWTH 5 DAYS Performed at [facility], [address] [place], [place] [address]    Report Status 04/23/2020 FINAL  Final       Today   Subjective:   Kelly Patel today has no headache,no chest or abdominal pain,no new weakness tingling or numbness, feels much better wants to go home today.   Objective:   Blood pressure 106/66, pulse 66, temperature 98.2 F (36.8 C), temperature source Oral, resp. rate 20, height 5' (1.524 m), weight 70.5 kg, SpO2 94 %, unknown if currently breastfeeding.   Intake/Output Summary (Last 24 hours) at 04/24/2020 1010 Last data filed at 04/24/2020 0900 Gross per 24 hour  Intake 1550 ml  Output 2800 ml  Net -1250 ml    Exam Awake Alert, Oriented x 3, No new F.N deficits, Normal affect Symmetrical Chest wall movement, Good air movement bilaterally, CTAB RRR,No Gallops,Rubs or new Murmurs, No Parasternal Heave +ve B.Sounds, Abd Soft,, No rebound -guarding or rigidity. No Cyanosis, Clubbing or edema, No new Rash or bruise  Data Review   CBC w Diff:  Lab Results  Component Value Date   WBC 8.4 04/24/2020   HGB 14.0 04/24/2020   HCT 42.3 04/24/2020   PLT 312 04/24/2020   LYMPHOPCT 17 04/24/2020   MONOPCT 7 04/24/2020   EOSPCT 1 04/24/2020   BASOPCT 0 04/24/2020    CMP:  Lab Results  Component Value Date   NA 135 04/24/2020   K 4.0 04/24/2020   CL 97 (L) 04/24/2020   CO2 29 04/24/2020   BUN 13 04/24/2020   CREATININE 0.66 04/24/2020   PROT 6.2 (L) 04/24/2020   ALBUMIN 2.8  (L) 04/24/2020   BILITOT 1.1 04/24/2020   ALKPHOS 65 04/24/2020   AST 14 (L) 04/24/2020   ALT 63 (H) 04/24/2020  .   Total Time in preparing paper work, data evaluation and todays exam - 35 minutes  Huey Bienenstock M.D on 04/24/2020 at 10:10 AM  [facility]   Office  [phone]

## 2020-05-05 ENCOUNTER — Encounter (HOSPITAL_COMMUNITY): Payer: Self-pay

## 2020-05-05 ENCOUNTER — Other Ambulatory Visit: Payer: Self-pay

## 2020-05-05 ENCOUNTER — Emergency Department (HOSPITAL_COMMUNITY)
Admission: EM | Admit: 2020-05-05 | Discharge: 2020-05-05 | Disposition: A | Discharge status: Home / Self Care | Payer: 59 | Attending: Emergency Medicine | Admitting: Emergency Medicine

## 2020-05-05 DIAGNOSIS — J9621 Acute and chronic respiratory failure with hypoxia: Secondary | ICD-10-CM | POA: Diagnosis not present

## 2020-05-05 DIAGNOSIS — H9202 Otalgia, left ear: Secondary | ICD-10-CM | POA: Diagnosis not present

## 2020-05-05 DIAGNOSIS — Z20822 Contact with and (suspected) exposure to covid-19: Secondary | ICD-10-CM | POA: Insufficient documentation

## 2020-05-05 DIAGNOSIS — J029 Acute pharyngitis, unspecified: Secondary | ICD-10-CM | POA: Diagnosis not present

## 2020-05-05 LAB — GROUP A STREP BY PCR: Group A Strep by PCR: NOT DETECTED

## 2020-05-05 MED ORDER — HYDROCODONE-HOMATROPINE 5-1.5 MG/5ML PO SYRP: 5.0000 mL | ORAL_SOLUTION | Freq: Four times a day (QID) | ORAL | 0 refills | Status: DC | PRN

## 2020-05-05 NOTE — ED Triage Notes (Signed)
Patient states she was diagnosed with Covid on 04/07/20. patient is currently on home O2 2L/min via [place].  patient c/o sore throat and left ear pain. patient also states she still has a cough that is worse at night.

## 2020-05-05 NOTE — Discharge Instructions (Addendum)
You were seen in the ER for sore throat and ear pain  Throat looked slightly red but otherwise normal  Strep test was negative  I suspect your throat hurts from ongoing lingering cough, maybe a virus  Take hycodan cough syrup to decrease cough  Drink warm fluids  Do warm water and salt gargles at least 3 times a day  Alternate ibuprofen or acetaminophen every 8 hours to help with inflammation and throat pain  Return to ER for worsening pain, difficulty swallowing, drooling, neck swelling

## 2020-05-05 NOTE — ED Provider Notes (Signed)
[facility] DEPT Provider Note   CSN: [id number] Arrival date & time: 05/05/20  1021     History Chief Complaint  Patient presents with  . Sore Throat  . Otalgia    Kelly Patel is a 34 y.o. female with pertinent past medical history of recent Covid pneumonia resulting in sepsis, severe acute hypoxic respiratory failure requiring admission 04/18/2020-04/24/2020 on home oxygen presents to the ER for evaluation of sudden onset, intermittent left-sided sore throat associated with radiating pain in the left ear for the last 3 days.  Has been doing warm water gargles and states the sore throat is a little bit better but is still present and she is worried about another infection in her throat.  Reports lingering cough ever since having Covid pneumonia that states is slowly improving.  States her cough is much worse at nighttime and only occasional during the day.  Her sore throat is typically worse when she wakes up in the morning.  Currently her pain is an 8/10 that is worse with swallowing.  She denies any fever.  States while she was admitted in the hospital her voice is very raspy but now her voice is actually back to normal.  Denies any neck pain or swelling.  No sick contacts at home.  She was discharged on 2 L [place] and states she has not had to increase oxygen requirements at home.  She finished her prednisone taper.  She is not currently taking any medicines.  She has an appointment with a doctor July 24 for hospital discharge follow-up.  She denies any fever.  No chest pain, no changes in her breathing.  HPI     Past Medical History:  Diagnosis Date  . Medical history non-contributory     Patient Active Problem List   Diagnosis Date Noted  . Acute respiratory disease due to COVID-19 virus 04/19/2020  . Acute hypoxemic respiratory failure due to COVID-19 [facility]) 04/18/2020    Past Surgical History:  Procedure Laterality Date  . ABDOMINAL HYSTERECTOMY    .  CESAREAN SECTION       OB History    Gravida  2   Para  1   Term  1   Preterm      AB      Living  1     SAB      TAB      Ectopic      Multiple      Live Births  1           Family History  Problem Relation Age of Onset  . Diabetes Mellitus II Father   . Diabetes Mother     Social History   Tobacco Use  . Smoking status: Never Smoker  . Smokeless tobacco: Never Used  Vaping Use  . Vaping Use: Never used  Substance Use Topics  . Alcohol use: No  . Drug use: No    Home Medications Prior to Admission medications   Medication Sig Start Date End Date Taking? Authorizing Provider  benzonatate (TESSALON) 100 MG capsule Take 100 mg by mouth 3 (three) times daily. 04/10/20   [provider]  dexamethasone (DECADRON) 6 MG tablet Take 1 tablet (6 mg total) by mouth daily. 04/25/20   Elgergawy, Silver Huguenin, MD  HYDROcodone-homatropine (HYCODAN) 5-1.5 MG/5ML syrup Take 5 mLs by mouth every 6 (six) hours as needed for cough. FOR DISRUPTIVE NIGHT TIME COUGH AND BODY ACHES 05/05/20   Kinnie Feil, PA-C  ondansetron (ZOFRAN) 4 MG tablet Take 4 mg by mouth daily as needed for nausea/vomiting. 04/10/20   [provider]    Allergies    Patient has no known allergies.  Review of Systems   Review of Systems  HENT: Positive for ear pain and sore throat.   Respiratory: Positive for cough (lingering, improving).   All other systems reviewed and are negative.   Physical Exam Updated Vital Signs BP 104/72 (BP Location: Right Arm)   Pulse 97   Temp 98.6 F (37 C) (Oral)   Resp 18   Ht 5' (1.524 m)   Wt 70.5 kg   LMP 07/19/2016   SpO2 100%   BMI 30.35 kg/m   Physical Exam Vitals and nursing note reviewed.  Constitutional:      General: She is not in acute distress.    Appearance: She is well-developed.     Comments: NAD.  HENT:     Head: Normocephalic and atraumatic.     Right Ear: External ear normal.     Left Ear: External ear normal.      Ears:     Comments: External ears normal bilaterally.  Mastoids normal, nontender.  No pain with external movement of the ear.  Ear canals normal without erythema, edema, discharge.  TMs fully visualized, normal without bulging, erythema, effusion.    Nose: Nose normal.     Mouth/Throat:     Pharynx: Posterior oropharyngeal erythema present.     Comments: Very mild erythema on both tonsillar pillars.  No significant edema or abnormalities of the uvula, tonsils, oropharynx or soft palate.  No trismus.  Normal tongue protrusion.  Normal sublingual space.  Tolerating secretions.  No stridor, drooling, hot potato voice.  No exudates, oropharyngeal lesions. Eyes:     General: No scleral icterus.    Conjunctiva/sclera: Conjunctivae normal.  Neck:     Comments: Trachea midline.  No cervical lymphadenopathy or tenderness.  No asymmetric edema of the anterior/lateral neck.  Full range of motion of the neck and pain. Cardiovascular:     Rate and Rhythm: Normal rate and regular rhythm.     Heart sounds: Normal heart sounds. No murmur heard.   Pulmonary:     Effort: Pulmonary effort is normal.     Breath sounds: Normal breath sounds. No wheezing.  Musculoskeletal:        General: No deformity. Normal range of motion.     Cervical back: Normal range of motion and neck supple.  Skin:    General: Skin is warm and dry.     Capillary Refill: Capillary refill takes less than 2 seconds.  Neurological:     Mental Status: She is alert and oriented to person, place, and time.  Psychiatric:        Behavior: Behavior normal.        Thought Content: Thought content normal.        Judgment: Judgment normal.     ED Results / Procedures / Treatments   Labs (all labs ordered are listed, but only abnormal results are displayed) Labs Reviewed  GROUP A STREP BY PCR    EKG None  Radiology No results found.  Procedures Procedures (including critical care time)  Medications Ordered in  ED Medications - No data to display  ED Course  I have reviewed the triage vital signs and the nursing notes.  Pertinent labs & imaging results that were available during my care of the patient were reviewed by me and considered  in my medical decision making (see chart for details).    MDM Rules/Calculators/A&P                           This patient complains of left sided sore throat with radiating left ear pain.  Improving with warm water gargles. No fever.   I obtained additional history from nursing notes and chart review.   Previous medical records available, nursing notes reviewed to obtain more history and assist with MDM.  IN summary, patient admitted on 5/28-6/3 for acute hypoxic respiratory failure due to COVID pneumonia, high oxygen requirment 15-20 L. Treated with IV remdesivir, steroids, actemra.  She was discharged with 2 L [place].   The differential diagnosis includes cough induced pharyngitis, viral pharyngitis.  Clinically no signs of severe facial, throat or neck infection. No concern for epiglottitis, RPA, PTA, Ludgwig's.  Ear exam is normal, suspect left ear pain is radiating pain from throat.    I ordered laboratory studies including strep test. Given benign exam, no fever, I do not think emergent imaging is necessary today.   I ordered, reviewed and personally visualized and interpreted the above labs and/or imaging studies.  Strep test is negative.   I have re-evaluated the patient and no clinical decline. Discussed sore throat likely from ongoing cough, possibly viral pharyngitis.   Dc with symptomatic management including warm water/salt gargles, NSAID, hycodan for cough.  Discussed symptoms that would warrant return to ER. She has follow up with PCP July 24 for hospital discharge.  Final Clinical Impression(s) / ED Diagnoses Final diagnoses:  Sore throat    Rx / DC Orders ED Discharge Orders         Ordered    HYDROcodone-homatropine (HYCODAN) 5-1.5 MG/5ML  syrup  Every 6 hours PRN     Discontinue  Reprint     05/05/20 1207           Liberty Handy, PA-C 05/05/20 1251    Linwood Dibbles, MD 05/07/20 1032

## 2020-06-11 ENCOUNTER — Encounter: Payer: Self-pay | Admitting: Nurse Practitioner

## 2020-06-11 ENCOUNTER — Ambulatory Visit (INDEPENDENT_AMBULATORY_CARE_PROVIDER_SITE_OTHER): Payer: 59 | Admitting: Nurse Practitioner

## 2020-06-11 ENCOUNTER — Other Ambulatory Visit: Payer: Self-pay

## 2020-06-11 DIAGNOSIS — R519 Headache, unspecified: Secondary | ICD-10-CM

## 2020-06-11 DIAGNOSIS — R5383 Other fatigue: Secondary | ICD-10-CM | POA: Diagnosis not present

## 2020-06-11 DIAGNOSIS — L659 Nonscarring hair loss, unspecified: Secondary | ICD-10-CM

## 2020-06-11 DIAGNOSIS — Z8616 Personal history of COVID-19: Secondary | ICD-10-CM

## 2020-06-11 DIAGNOSIS — N898 Other specified noninflammatory disorders of vagina: Secondary | ICD-10-CM

## 2020-06-11 MED ORDER — FLUCONAZOLE 150 MG PO TABS: 150.0000 mg | ORAL_TABLET | Freq: Once | ORAL | 0 refills | Status: AC

## 2020-06-11 NOTE — Progress Notes (Addendum)
[facility] Patient [facility] [address] Kelly Patel [place], [place]  [address] Phone:  [phone]   Fax:  [phone]    Virtual telephone visit      Virtual Visit via Telephone Note   This visit type was conducted due to national recommendations for restrictions regarding the COVID-19 Pandemic (e.g. social distancing) in an effort to limit this patient's exposure and mitigate transmission in our community. Due to her co-morbid illnesses, this patient is at least at moderate risk for complications without adequate follow up. This format is felt to be most appropriate for this patient at this time. The patient did not have access to video technology or had technical difficulties with video requiring transitioning to audio format only (telephone). Physical exam was limited to content and character of the telephone converstion.    Patient location: home Provider location: office  Patient consented to virtual visit.   Patient: Kelly Patel   DOB: 11-18-1986   34 y.o. Female  MRN: [id number] Visit Date: 06/11/2020  Today's Provider: Barbette Merino, NP  Subjective:    Chief Complaint  Patient presents with  . Establish Care    positive covid new pt establish   . Fatigue  . Back Pain   HPI  SUBJECTIVE: Kelly Patel is a 34 y.o. female who was diagnosed on 04/18/2020 with pneumonia related to COVID-19.  She had a 7-day hospital stay where she was treated with antiviral agents, monoclonal antibodies along with steroids and released.  She did have to return to the emergency room on 05/05/2020 for recurrent sore throat.  Patient is establishing care. She complains of symptoms of a headache. Symptoms include generalized fatigue, neck and back pain. . Onset of symptoms was ongoing since her COV-19 infection several weeks ago, and has been stable since that time. Treatment to date: none.  Vaginitis Patient also complains of an abnormal vaginal discharge. Symptoms have been present for several  days. Vaginal symptoms: itch. Contraception: status post hysterectomy. She denies abnormal bleeding, blisters, bumps, burning, dyspareunia, lesions, local irritation, odor, pain, post coital bleeding, tears and warts. Sexually transmitted infection risk: very low risk of STD exposure. She admits that she was treated for an UTI and the symptoms presented after the anbx treatment.   Patient Active Problem List   Diagnosis Date Noted  . Acute respiratory disease due to COVID-19 virus 04/19/2020  . Acute hypoxemic respiratory failure due to COVID-19 (HCC) 04/18/2020      Medications: Outpatient Medications Prior to Visit  Medication Sig  . acetaminophen (TYLENOL) 325 MG tablet Take 650 mg by mouth every 6 (six) hours as needed.  Marland Kitchen ibuprofen (ADVIL) 200 MG tablet Take 200 mg by mouth every 6 (six) hours as needed.  . [DISCONTINUED] benzonatate (TESSALON) 100 MG capsule Take 100 mg by mouth 3 (three) times daily. (Patient not taking: Reported on 06/11/2020)  . [DISCONTINUED] dexamethasone (DECADRON) 6 MG tablet Take 1 tablet (6 mg total) by mouth daily. (Patient not taking: Reported on 06/11/2020)  . [DISCONTINUED] HYDROcodone-homatropine (HYCODAN) 5-1.5 MG/5ML syrup Take 5 mLs by mouth every 6 (six) hours as needed for cough. FOR DISRUPTIVE NIGHT TIME COUGH AND BODY ACHES (Patient not taking: Reported on 06/11/2020)  . [DISCONTINUED] ondansetron (ZOFRAN) 4 MG tablet Take 4 mg by mouth daily as needed for nausea/vomiting. (Patient not taking: Reported on 06/11/2020)   No facility-administered medications prior to visit.    Review of Systems  Constitutional: Positive for fatigue.  HENT: Negative.   Eyes: Negative.  Respiratory: Negative.   Cardiovascular: Negative.   Gastrointestinal: Negative.   Endocrine:       Hair loss  Musculoskeletal: Positive for back pain and neck pain.  Neurological: Positive for headaches.    Last CBC Lab Results  Component Value Date   WBC 8.4 04/24/2020   HGB  14.0 04/24/2020   HCT 42.3 04/24/2020   MCV 83.4 04/24/2020   MCH 27.6 04/24/2020   RDW 13.1 04/24/2020   PLT 312 04/24/2020   Last metabolic panel Lab Results  Component Value Date   GLUCOSE 185 (H) 04/24/2020   NA 135 04/24/2020   K 4.0 04/24/2020   CL 97 (L) 04/24/2020   CO2 29 04/24/2020   BUN 13 04/24/2020   CREATININE 0.66 04/24/2020   GFRNONAA >60 04/24/2020   GFRAA >60 04/24/2020   CALCIUM 8.6 (L) 04/24/2020   PROT 6.2 (L) 04/24/2020   ALBUMIN 2.8 (L) 04/24/2020   BILITOT 1.1 04/24/2020   ALKPHOS 65 04/24/2020   AST 14 (L) 04/24/2020   ALT 63 (H) 04/24/2020   ANIONGAP 9 04/24/2020   Last hemoglobin A1c No results found for: HGBA1C Last thyroid functions No results found for: TSH, T3TOTAL, T4TOTAL, THYROIDAB Last vitamin D No results found for: 25OHVITD2, 25OHVITD3, VD25OH Last vitamin B12 and Folate No results found for: VITAMINB12, FOLATE      Objective:     Patient able to speak in full sentences without shortness of breath  LMP 07/19/2016  BP Readings from Last 3 Encounters:  05/05/20 108/76  04/24/20 106/66  08/24/16 101/63        Assessment & Plan:    Assessment  Primary Diagnosis & Pertinent Problem List: The primary encounter diagnosis was Personal history of covid-19. Diagnoses of Fatigue, unspecified type, Vaginal itching, Acute nonintractable headache, unspecified headache type, and Hair loss were also pertinent to this visit.  Visit Diagnosis: 1. Personal history of covid-19 Long discussion with patient concerning post Covid symptoms.  Encourage patient to consider utilizing the post Covid transition care center for ongoing evaluation due to her current symptoms.  Patient to discuss with her husband and then follow-up.  We will have patient to come in the office for 4-week follow-up with repeat labs  2. Fatigue, unspecified type   3. Vaginal itching possible vaginal candidiasis will prescribe fluconazole 150 mg x 1.  Instructed patient if  symptoms do not improve to follow-up for further evaluation  4. Acute nonintractable headache, unspecified headache type patient to continue to monitor symptoms persist or get worse patient to make us aware. Hair loss  Recommend patient to start over-the-counter hair skin and nail vitamin to see if this will help.       Plan of Care  Pharmacotherapy (Medications Ordered): Meds ordered this encounter  Medications  . fluconazole (DIFLUCAN) 150 MG tablet    Sig: Take 1 tablet (150 mg total) by mouth once for 1 dose.    Dispense:  1 tablet    Refill:  0    Order Specific Question:   Supervising Provider    Answer:   Quentin AngstJEGEDE, OLUGBEMIGA E [[id number]]   New Prescriptions   FLUCONAZOLE (DIFLUCAN) 150 MG TABLET    Take 1 tablet (150 mg total) by mouth once for 1 dose.    Patient instructions provided during this appointment: Patient Instructions  [facility]  [address] [place], [place] [id number]  [phone]  Common "Long Hauler" Symptoms   The list of "long hauler"  symptoms is long and inconsistent. For some people, the lasting COVID symptoms are nothing like the original symptoms when they were first infected with COVID. The most common "long hauler' symptoms include:  . Coughing . Ongoing, sometimes debilitating, fatigue  . Body aches  . Joint pain  . Shortness of breath  . Loss of taste and smell -- even if this didn't occur during the height of illness . Difficulty sleeping  . Headaches  . Brain fo  Brain fog is among the most confusing symptoms for long haulers. Patients report being unusually forgetful, confused or unable to concentrate even enough to watch TV. This can happen to people who were in an intensive care unit for a while, but it's relatively rare. However, it is happening to a variety of patients, including those who weren't hospitalized.  Some people have reported feeling better for days or even weeks then relapsing.  For others, it's a case of just not feeling like themselves.     COVID-19 Frequently Asked Questions  COVID-19 (coronavirus disease) is an infection that is caused by a large family of viruses. Some viruses cause illness in people and others cause illness in animals like camels, cats, and bats. In some cases, the viruses that cause illness in animals can spread to humans. Where did the coronavirus come from? In December 2019, [place] told the [employer] [facility]) of several cases of lung disease (human respiratory illness). These cases were linked to an open seafood and livestock market in the city of [place]. The link to the seafood and livestock market suggests that the virus may have spread from animals to humans. However, since that first outbreak in December, the virus has also been shown to spread from person to person. What is the name of the disease and the virus? Disease name Early on, this disease was called novel coronavirus. This is because scientists determined that the disease was caused by a new (novel) respiratory virus. The World Health Organization [facility]) has now named the disease COVID-19, or coronavirus disease. Virus name The virus that causes the disease is called severe acute respiratory syndrome coronavirus 2 (SARS-CoV-2). More information on disease and virus naming World Health Organization [facility]): www.who.int/emergencies/diseases/novel-coronavirus-2019/technical-guidance/naming-the-coronavirus-disease-(covid-2019)-and-the-virus-that-causes-it Who is at risk for complications from coronavirus disease? Some people may be at higher risk for complications from coronavirus disease. This includes older adults and people who have chronic diseases, such as heart disease, diabetes, and lung disease. If you are at higher risk for complications, take these extra precautions:  Stay home as much as possible.  Avoid social gatherings and travel.  Avoid close contact with  others. Stay at least 6 ft (2 m) away from others, if possible.  Wash your hands often with soap and water for at least 20 seconds.  Avoid touching your face, mouth, nose, or eyes.  Keep supplies on hand at home, such as food, medicine, and cleaning supplies.  If you must go out in public, wear a cloth face covering or face mask. Make sure your mask covers your nose and mouth. How does coronavirus disease spread? The virus that causes coronavirus disease spreads easily from person to person (is contagious). You may catch the virus by:  Breathing in droplets from an infected person. Droplets can be spread by a person breathing, speaking, singing, coughing, or sneezing.  Touching something, like a table or a doorknob, that was exposed to the virus (contaminated) and then touching your mouth, nose, or eyes. Can I get the virus  from touching surfaces or objects? There is still a lot that we do not know about the virus that causes coronavirus disease. Scientists are basing a lot of information on what they know about similar viruses, such as:  Viruses cannot generally survive on surfaces for long. They need a human body (host) to survive.  It is more likely that the virus is spread by close contact with people who are sick (direct contact), such as through: ? Shaking hands or hugging. ? Breathing in respiratory droplets that travel through the air. Droplets can be spread by a person breathing, speaking, singing, coughing, or sneezing.  It is less likely that the virus is spread when a person touches a surface or object that has the virus on it (indirect contact). The virus may be able to enter the body if the person touches a surface or object and then touches his or her face, eyes, nose, or mouth. Can a person spread the virus without having symptoms of the disease? It may be possible for the virus to spread before a person has symptoms of the disease, but this is most likely not the main way  the virus is spreading. It is more likely for the virus to spread by being in close contact with people who are sick and breathing in the respiratory droplets spread by a person breathing, speaking, singing, coughing, or sneezing. What are the symptoms of coronavirus disease? Symptoms vary from person to person and can range from mild to severe. Symptoms may include:  Fever or chills.  Cough.  Difficulty breathing or feeling short of breath.  Headaches, body aches, or muscle aches.  Runny or stuffy (congested) nose.  Sore throat.  New loss of taste or smell.  Nausea, vomiting, or diarrhea. These symptoms can appear anywhere from 2 to 14 days after you have been exposed to the virus. Some people may not have any symptoms. If you develop symptoms, call your health care provider. People with severe symptoms may need hospital care. Should I be tested for this virus? Your health care provider will decide whether to test you based on your symptoms, history of exposure, and your risk factors. How does a health care provider test for this virus? Health care providers will collect samples to send for testing. Samples may include:  Taking a swab of fluid from the back of your nose and throat, your nose, or your throat.  Taking fluid from the lungs by having you cough up mucus (sputum) into a sterile cup.  Taking a blood sample. Is there a treatment or vaccine for this virus? Currently, there is no vaccine to prevent coronavirus disease. Also, there are no medicines like antibiotics or antivirals to treat the virus. A person who becomes sick is given supportive care, which means rest and fluids. A person may also relieve his or her symptoms by using over-the-counter medicines that treat sneezing, coughing, and runny nose. These are the same medicines that a person takes for the common cold. If you develop symptoms, call your health care provider. People with severe symptoms may need hospital  care. What can I do to protect myself and my family from this virus?     You can protect yourself and your family by taking the same actions that you would take to prevent the spread of other viruses. Take the following actions:  Wash your hands often with soap and water for at least 20 seconds. If soap and water are not available, use alcohol-based  hand sanitizer.  Avoid touching your face, mouth, nose, or eyes.  Cough or sneeze into a tissue, sleeve, or elbow. Do not cough or sneeze into your hand or the air. ? If you cough or sneeze into a tissue, throw it away immediately and wash your hands.  Disinfect objects and surfaces that you frequently touch every day.  Stay away from people who are sick.  Avoid going out in public, follow guidance from your state and local health authorities.  Avoid crowded indoor spaces. Stay at least 6 ft (2 m) away from others.  If you must go out in public, wear a cloth face covering or face mask. Make sure your mask covers your nose and mouth.  Stay home if you are sick, except to get medical care. Call your health care provider before you get medical care. Your health care provider will tell you how long to stay home.  Make sure your vaccines are up to date. Ask your health care provider what vaccines you need. What should I do if I need to travel? Follow travel recommendations from your local health authority, the CDC, and WHO. Travel information and advice  Centers for Disease Control and Prevention (CDC): GeminiCard.gl  World Health Organization [facility]): PreviewDomains.se Know the risks and take action to protect your health  You are at higher risk of getting coronavirus disease if you are traveling to areas with an outbreak or if you are exposed to travelers from areas with an outbreak.  Wash your hands often and practice good hygiene to lower the  risk of catching or spreading the virus. What should I do if I am sick? General instructions to stop the spread of infection  Wash your hands often with soap and water for at least 20 seconds. If soap and water are not available, use alcohol-based hand sanitizer.  Cough or sneeze into a tissue, sleeve, or elbow. Do not cough or sneeze into your hand or the air.  If you cough or sneeze into a tissue, throw it away immediately and wash your hands.  Stay home unless you must get medical care. Call your health care provider or local health authority before you get medical care.  Avoid public areas. Do not take public transportation, if possible.  If you can, wear a mask if you must go out of the house or if you are in close contact with someone who is not sick. Make sure your mask covers your nose and mouth. Keep your home clean  Disinfect objects and surfaces that are frequently touched every day. This may include: ? Counters and tables. ? Doorknobs and light switches. ? Sinks and faucets. ? Electronics such as phones, remote controls, keyboards, computers, and tablets.  Wash dishes in hot, soapy water or use a dishwasher. Air-dry your dishes.  Wash laundry in hot water. Prevent infecting other household members  Let healthy household members care for children and pets, if possible. If you have to care for children or pets, wash your hands often and wear a mask.  Sleep in a different bedroom or bed, if possible.  Do not share personal items, such as razors, toothbrushes, deodorant, combs, brushes, towels, and washcloths. Where to find more information Centers for Disease Control and Prevention (CDC)  Information and news updates: CardRetirement.cz World Health Organization [facility])  Information and news updates: AffordableSalon.es  Coronavirus health topic: https://thompson-craig.com/  Questions and answers on  COVID-19: kruiseway.com  Global tracker: who.sprinklr.com American Academy of Pediatrics (AAP)  Information  for families: www.healthychildren.org/English/health-issues/conditions/chest-lungs/Pages/2019-Novel-Coronavirus.aspx The coronavirus situation is changing rapidly. Check your local health authority website or the CDC and [facility] websites for updates and news. When should I contact a health care provider?  Contact your health care provider if you have symptoms of an infection, such as fever or cough, and you: ? Have been near anyone who is known to have coronavirus disease. ? Have come into contact with a person who is suspected to have coronavirus disease. ? Have traveled to an area where there is an outbreak of COVID-19. When should I get emergency medical care?  Get help right away by calling your local emergency services (911 in the [place] if you have: ? Trouble breathing. ? Pain or pressure in your chest. ? Confusion. ? Blue-tinged lips and fingernails. ? Difficulty waking from sleep. ? Symptoms that get worse. Let the emergency medical personnel know if you think you have coronavirus disease. Summary  A new respiratory virus is spreading from person to person and causing COVID-19 (coronavirus disease).  The virus that causes COVID-19 appears to spread easily. It spreads from one person to another through droplets from breathing, speaking, singing, coughing, or sneezing.  Older adults and those with chronic diseases are at higher risk of disease. If you are at higher risk for complications, take extra precautions.  There is currently no vaccine to prevent coronavirus disease. There are no medicines, such as antibiotics or antivirals, to treat the virus.  You can protect yourself and your family by washing your hands often, avoiding touching your face, and covering your coughs and sneezes. This information is not intended to replace advice  given to you by your health care provider. Make sure you discuss any questions you have with your health care provider. Document Revised: 09/07/2019 Document Reviewed: 03/06/2019 Elsevier Patient Education  2020 [employer].     I discussed the assessment and treatment plan with the patient. The patient was provided an opportunity to ask questions and all were answered. The patient agreed with the plan and demonstrated an understanding of the instructions.   The patient was advised to call back or seek an in-person evaluation if the symptoms worsen or if the condition fails to improve as anticipated.  I provided 12 minutes of non-face-to-face time during this encounter.   Barbette Merino, NP  [facility] Patient [facility] [phone] (phone) [phone] (fax)  [facility]

## 2020-06-11 NOTE — Patient Instructions (Addendum)
[facility]  [address] [place], [place] [address]  [phone]  Common "Long Hauler" Symptoms   The list of "long hauler" symptoms is long and inconsistent. For some people, the lasting COVID symptoms are nothing like the original symptoms when they were first infected with COVID. The most common "long hauler' symptoms include:  . Coughing . Ongoing, sometimes debilitating, fatigue  . Body aches  . Joint pain  . Shortness of breath  . Loss of taste and smell -- even if this didn't occur during the height of illness . Difficulty sleeping  . Headaches  . Brain fo  Brain fog is among the most confusing symptoms for long haulers. Patients report being unusually forgetful, confused or unable to concentrate even enough to watch TV. This can happen to people who were in an intensive care unit for a while, but it's relatively rare. However, it is happening to a variety of patients, including those who weren't hospitalized.  Some people have reported feeling better for days or even weeks then relapsing. For others, it's a case of just not feeling like themselves.     COVID-19 Frequently Asked Questions  COVID-19 (coronavirus disease) is an infection that is caused by a large family of viruses. Some viruses cause illness in people and others cause illness in animals like camels, cats, and bats. In some cases, the viruses that cause illness in animals can spread to humans. Where did the coronavirus come from? In December 2019, [place] told the [employer] [facility]) of several cases of lung disease (human respiratory illness). These cases were linked to an open seafood and livestock market in the city of [place]. The link to the seafood and livestock market suggests that the virus may have spread from animals to humans. However, since that first outbreak in December, the virus has also been shown to spread from person to person. What is the name  of the disease and the virus? Disease name Early on, this disease was called novel coronavirus. This is because scientists determined that the disease was caused by a new (novel) respiratory virus. The World Health Organization [facility]) has now named the disease COVID-19, or coronavirus disease. Virus name The virus that causes the disease is called severe acute respiratory syndrome coronavirus 2 (SARS-CoV-2). More information on disease and virus naming World Health Organization [facility]): www.who.int/emergencies/diseases/novel-coronavirus-2019/technical-guidance/naming-the-coronavirus-disease-(covid-2019)-and-the-virus-that-causes-it Who is at risk for complications from coronavirus disease? Some people may be at higher risk for complications from coronavirus disease. This includes older adults and people who have chronic diseases, such as heart disease, diabetes, and lung disease. If you are at higher risk for complications, take these extra precautions:  Stay home as much as possible.  Avoid social gatherings and travel.  Avoid close contact with others. Stay at least 6 ft (2 m) away from others, if possible.  Wash your hands often with soap and water for at least 20 seconds.  Avoid touching your face, mouth, nose, or eyes.  Keep supplies on hand at home, such as food, medicine, and cleaning supplies.  If you must go out in public, wear a cloth face covering or face mask. Make sure your mask covers your nose and mouth. How does coronavirus disease spread? The virus that causes coronavirus disease spreads easily from person to person (is contagious). You may catch the virus by:  Breathing in droplets from an infected person. Droplets can be spread by a person breathing, speaking, singing, coughing, or sneezing.  Touching something, like a table or a doorknob, that was exposed to the virus (contaminated) and then touching your mouth, nose, or eyes. Can I get the virus from touching surfaces  or objects? There is still a lot that we do not know about the virus that causes coronavirus disease. Scientists are basing a lot of information on what they know about similar viruses, such as:  Viruses cannot generally survive on surfaces for long. They need a human body (host) to survive.  It is more likely that the virus is spread by close contact with people who are sick (direct contact), such as through: ? Shaking hands or hugging. ? Breathing in respiratory droplets that travel through the air. Droplets can be spread by a person breathing, speaking, singing, coughing, or sneezing.  It is less likely that the virus is spread when a person touches a surface or object that has the virus on it (indirect contact). The virus may be able to enter the body if the person touches a surface or object and then touches his or her face, eyes, nose, or mouth. Can a person spread the virus without having symptoms of the disease? It may be possible for the virus to spread before a person has symptoms of the disease, but this is most likely not the main way the virus is spreading. It is more likely for the virus to spread by being in close contact with people who are sick and breathing in the respiratory droplets spread by a person breathing, speaking, singing, coughing, or sneezing. What are the symptoms of coronavirus disease? Symptoms vary from person to person and can range from mild to severe. Symptoms may include:  Fever or chills.  Cough.  Difficulty breathing or feeling short of breath.  Headaches, body aches, or muscle aches.  Runny or stuffy (congested) nose.  Sore throat.  New loss of taste or smell.  Nausea, vomiting, or diarrhea. These symptoms can appear anywhere from 2 to 14 days after you have been exposed to the virus. Some people may not have any symptoms. If you develop symptoms, call your health care provider. People with severe symptoms may need hospital care. Should I be  tested for this virus? Your health care provider will decide whether to test you based on your symptoms, history of exposure, and your risk factors. How does a health care provider test for this virus? Health care providers will collect samples to send for testing. Samples may include:  Taking a swab of fluid from the back of your nose and throat, your nose, or your throat.  Taking fluid from the lungs by having you cough up mucus (sputum) into a sterile cup.  Taking a blood sample. Is there a treatment or vaccine for this virus? Currently, there is no vaccine to prevent coronavirus disease. Also, there are no medicines like antibiotics or antivirals to treat the virus. A person who becomes sick is given supportive care, which means rest and fluids. A person may also relieve his or her symptoms by using over-the-counter medicines that treat sneezing, coughing, and runny nose. These are the same medicines that a person takes for the common cold. If you develop symptoms, call your health care provider. People with severe symptoms may need hospital care. What can I do to protect myself and my family from this virus?     You can protect yourself and your family by taking the same actions that you would take to prevent the spread of other  viruses. Take the following actions:  Wash your hands often with soap and water for at least 20 seconds. If soap and water are not available, use alcohol-based hand sanitizer.  Avoid touching your face, mouth, nose, or eyes.  Cough or sneeze into a tissue, sleeve, or elbow. Do not cough or sneeze into your hand or the air. ? If you cough or sneeze into a tissue, throw it away immediately and wash your hands.  Disinfect objects and surfaces that you frequently touch every day.  Stay away from people who are sick.  Avoid going out in public, follow guidance from your state and local health authorities.  Avoid crowded indoor spaces. Stay at least 6 ft (2 m)  away from others.  If you must go out in public, wear a cloth face covering or face mask. Make sure your mask covers your nose and mouth.  Stay home if you are sick, except to get medical care. Call your health care provider before you get medical care. Your health care provider will tell you how long to stay home.  Make sure your vaccines are up to date. Ask your health care provider what vaccines you need. What should I do if I need to travel? Follow travel recommendations from your local health authority, the CDC, and WHO. Travel information and advice  Centers for Disease Control and Prevention (CDC): GeminiCard.gl  World Health Organization [facility]): PreviewDomains.se Know the risks and take action to protect your health  You are at higher risk of getting coronavirus disease if you are traveling to areas with an outbreak or if you are exposed to travelers from areas with an outbreak.  Wash your hands often and practice good hygiene to lower the risk of catching or spreading the virus. What should I do if I am sick? General instructions to stop the spread of infection  Wash your hands often with soap and water for at least 20 seconds. If soap and water are not available, use alcohol-based hand sanitizer.  Cough or sneeze into a tissue, sleeve, or elbow. Do not cough or sneeze into your hand or the air.  If you cough or sneeze into a tissue, throw it away immediately and wash your hands.  Stay home unless you must get medical care. Call your health care provider or local health authority before you get medical care.  Avoid public areas. Do not take public transportation, if possible.  If you can, wear a mask if you must go out of the house or if you are in close contact with someone who is not sick. Make sure your mask covers your nose and mouth. Keep your home clean  Disinfect objects and  surfaces that are frequently touched every day. This may include: ? Counters and tables. ? Doorknobs and light switches. ? Sinks and faucets. ? Electronics such as phones, remote controls, keyboards, computers, and tablets.  Wash dishes in hot, soapy water or use a dishwasher. Air-dry your dishes.  Wash laundry in hot water. Prevent infecting other household members  Let healthy household members care for children and pets, if possible. If you have to care for children or pets, wash your hands often and wear a mask.  Sleep in a different bedroom or bed, if possible.  Do not share personal items, such as razors, toothbrushes, deodorant, combs, brushes, towels, and washcloths. Where to find more information Centers for Disease Control and Prevention (CDC)  Information and news updates: CardRetirement.cz World Health Organization [facility])  Information and news updates: AffordableSalon.eswww.who.int/emergencies/diseases/novel-coronavirus-2019  Coronavirus health topic: https://thompson-craig.com/www.who.int/health-topics/coronavirus  Questions and answers on COVID-19: kruiseway.comwww.who.int/news-room/q-a-detail/q-a-coronaviruses  Global tracker: who.sprinklr.com American Academy of Pediatrics (AAP)  Information for families: www.healthychildren.org/English/health-issues/conditions/chest-lungs/Pages/2019-Novel-Coronavirus.aspx The coronavirus situation is changing rapidly. Check your local health authority website or the CDC and [facility] EatWHO websites for updates and news. When should I contact a health care provider?  Contact your health care provider if you have symptoms of an infection, such as fever or cough, and you: ? Have been near anyone who is known to have coronavirus disease. ? Have come into contact with a person who is suspected to have coronavirus disease. ? Have traveled to an area where there is an outbreak of COVID-19. When should I get emergency medical care?  Get help right away by calling your local emergency  services (911 in the [place] if you have: ? Trouble breathing. ? Pain or pressure in your chest. ? Confusion. ? Blue-tinged lips and fingernails. ? Difficulty waking from sleep. ? Symptoms that get worse. Let the emergency medical personnel know if you think you have coronavirus disease. Summary  A new respiratory virus is spreading from person to person and causing COVID-19 (coronavirus disease).  The virus that causes COVID-19 appears to spread easily. It spreads from one person to another through droplets from breathing, speaking, singing, coughing, or sneezing.  Older adults and those with chronic diseases are at higher risk of disease. If you are at higher risk for complications, take extra precautions.  There is currently no vaccine to prevent coronavirus disease. There are no medicines, such as antibiotics or antivirals, to treat the virus.  You can protect yourself and your family by washing your hands often, avoiding touching your face, and covering your coughs and sneezes. This information is not intended to replace advice given to you by your health care provider. Make sure you discuss any questions you have with your health care provider. Document Revised: 09/07/2019 Document Reviewed: 03/06/2019 Elsevier Patient Education  2020 [employer]

## 2022-03-12 IMAGING — CT CT ANGIO CHEST
2 of 6 series · 18 of 46 positions shown · IV contrast (omnipaque)
Comparison: Chest radiograph from one day prior.

CLINICAL DATA: Inpatient. Dyspnea. Intermittent cough.
Desaturation. X2GH9-4S positive.

EXAM:
CT ANGIOGRAPHY CHEST WITH CONTRAST
TECHNIQUE: Multidetector CT imaging of the chest was performed using the
standard protocol during bolus administration of intravenous
contrast. Multiplanar CT image reconstructions and MIPs were
obtained to evaluate the vascular anatomy.
CONTRAST:  100mL OMNIPAQUE IOHEXOL 350 MG/ML SOLN

[Series 6: thins · axial · 0.80mm/px · z∈[+1098,+1298]mm · 15 of 220 slices shown]
[im 10/220  lung]
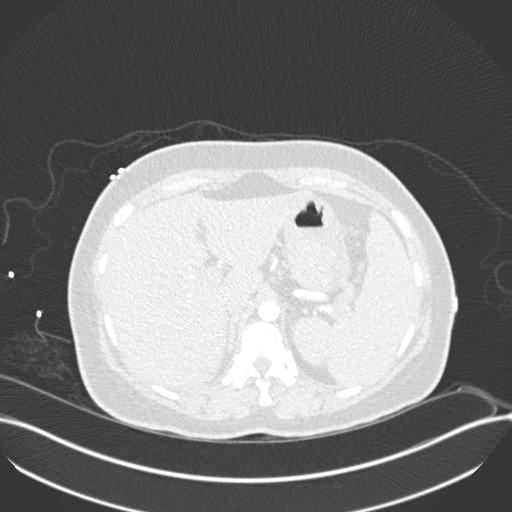
[im 29/220  soft-tissue]
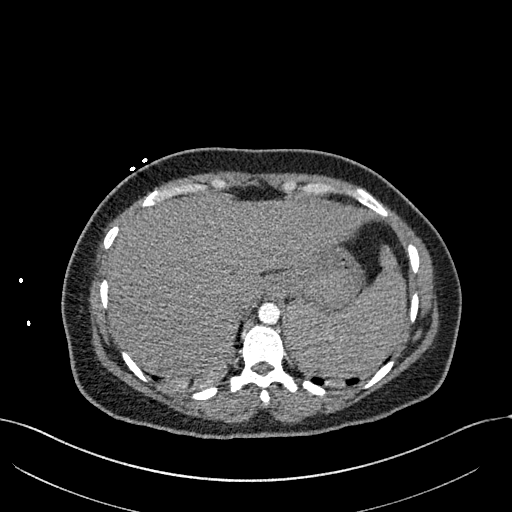
[im 39/220  lung]
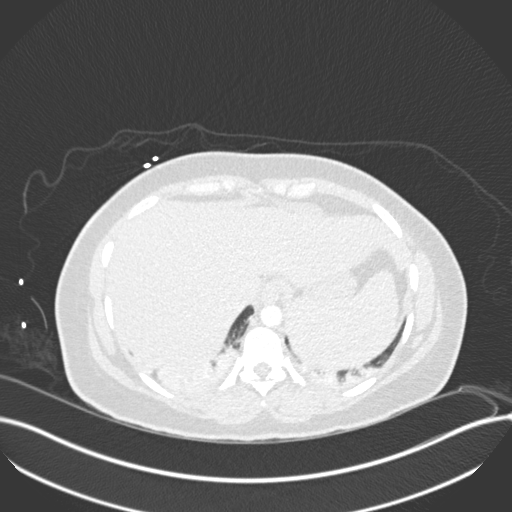
[im 58/220  soft-tissue]
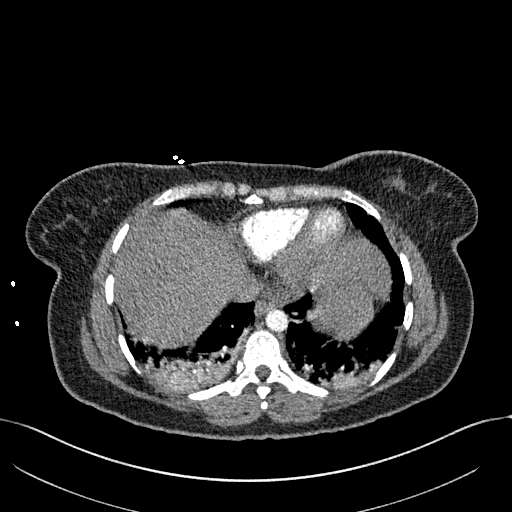
[im 67/220  lung]
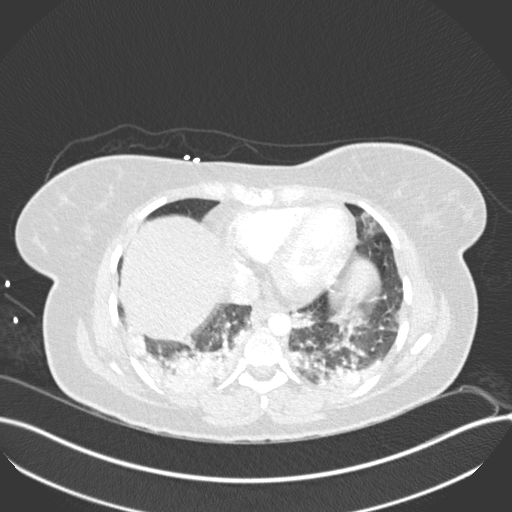
[im 86/220  soft-tissue]
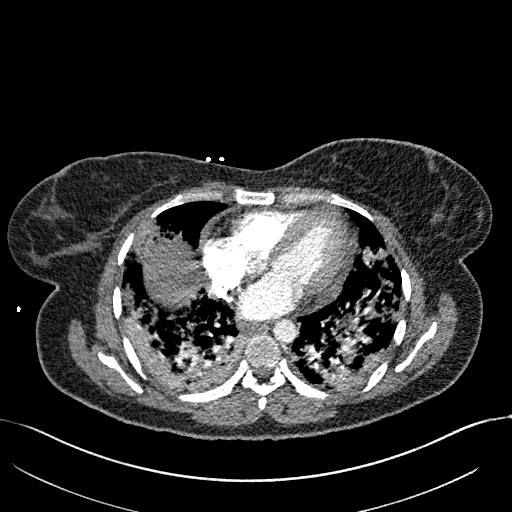
[im 96/220  lung]
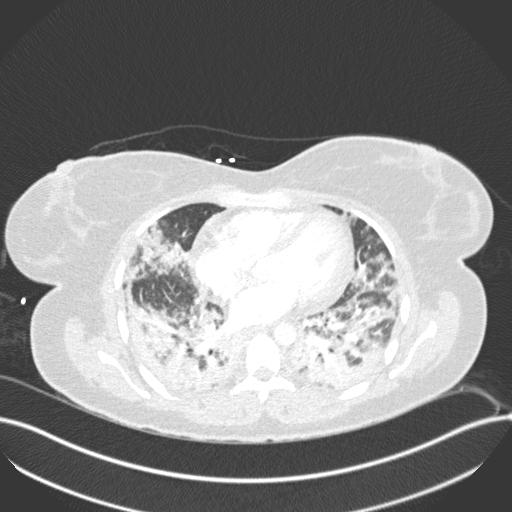
[im 115/220  soft-tissue]
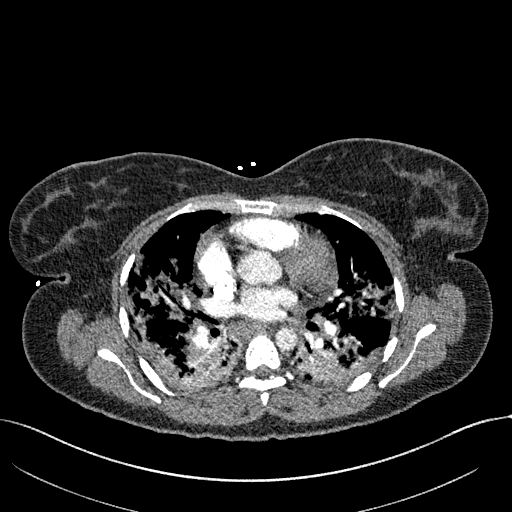
[im 124/220  lung]
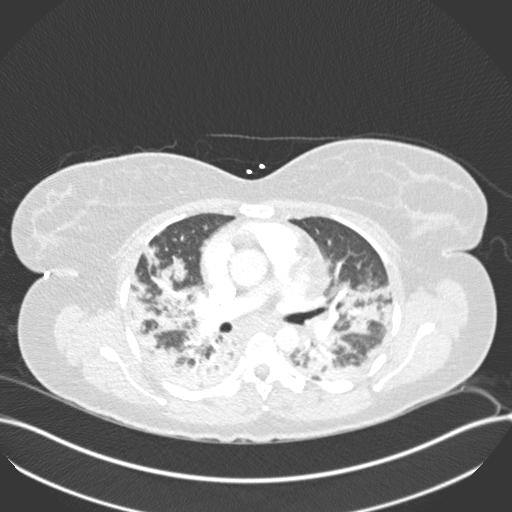
[im 134/220  soft-tissue]
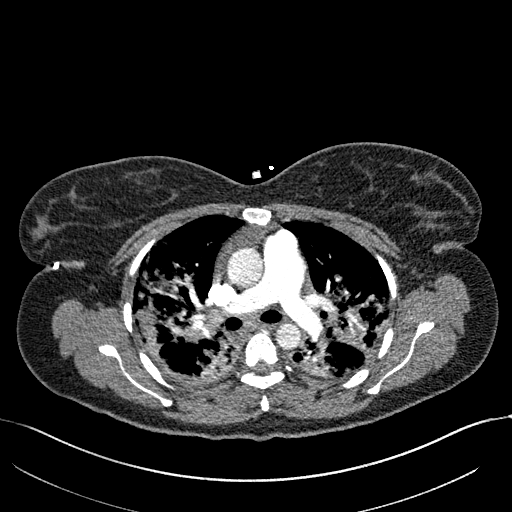
[im 153/220  lung]
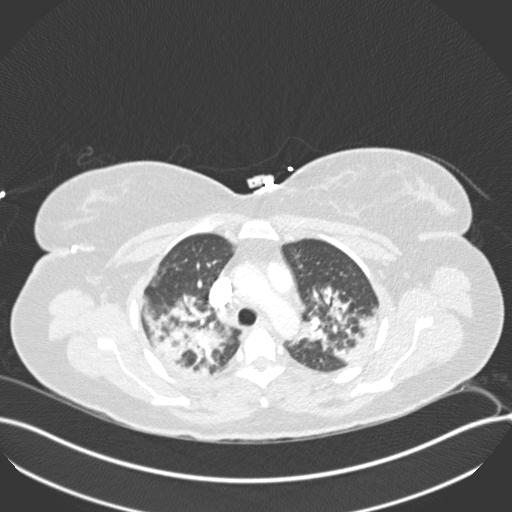
[im 162/220  soft-tissue]
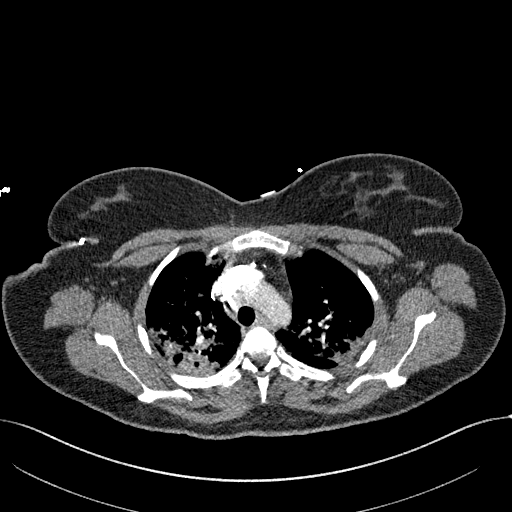
[im 181/220  lung]
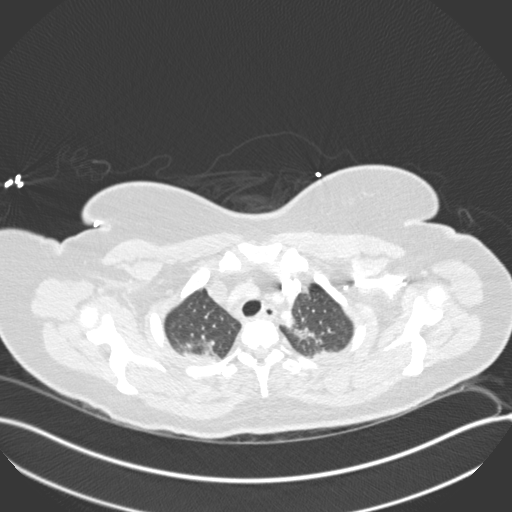
[im 191/220  soft-tissue]
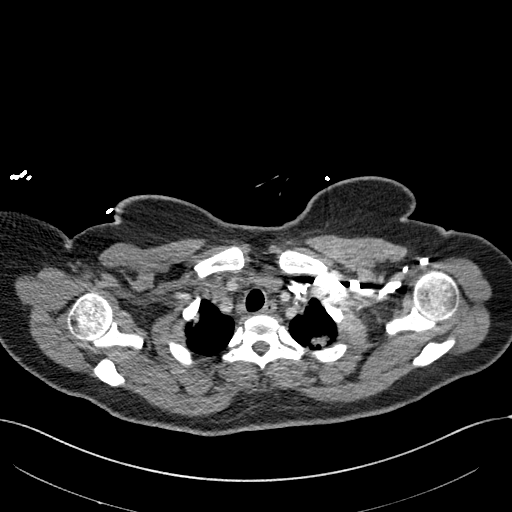
[im 210/220  lung]
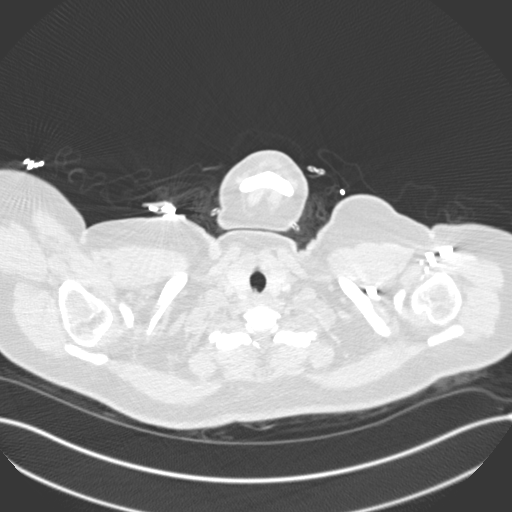

[Series 8: coronal mpr · coronal · 0.47mm/px · 3 of 125 slices shown]
[im 32/125  soft-tissue]
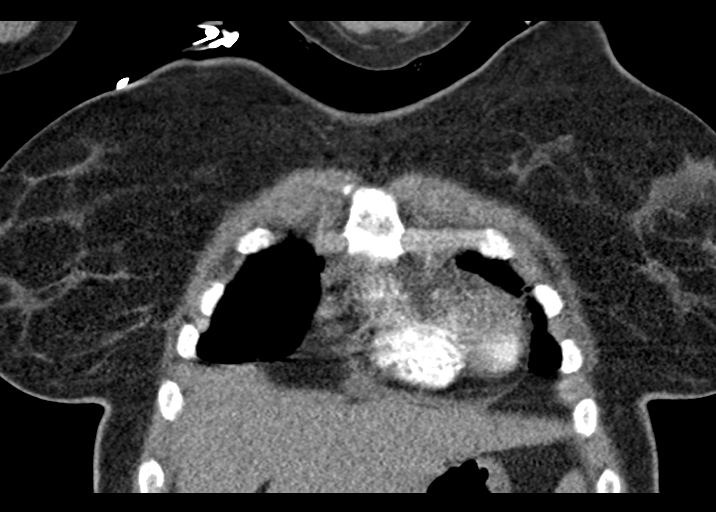
[im 63/125  soft-tissue]
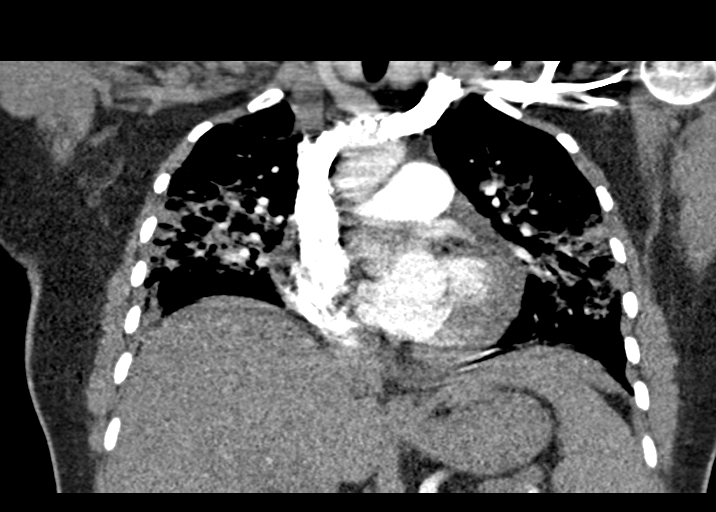
[im 94/125  soft-tissue]
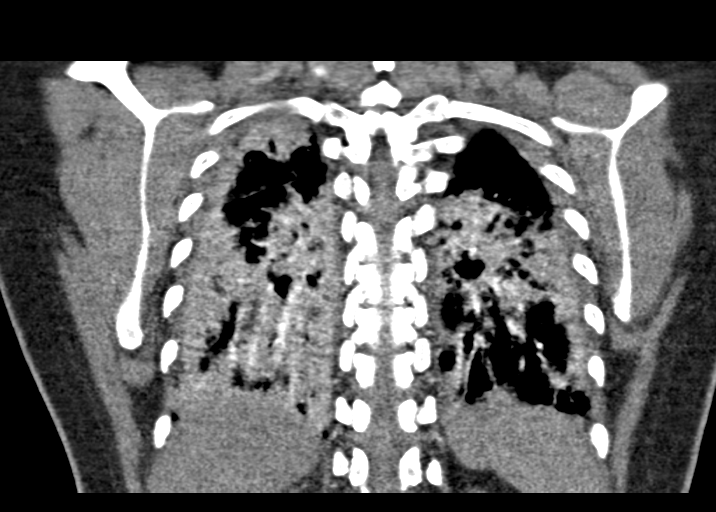

[18 of 46 positions shown; findings below may reference images not displayed]

FINDINGS: Cardiovascular: The study is high quality for the evaluation of
pulmonary embolism. There are no filling defects in the central,
lobar, segmental or subsegmental pulmonary artery branches to
suggest acute pulmonary embolism. Normal course and caliber of the
thoracic aorta. Top-normal caliber main pulmonary artery (3.1 cm
diameter). Normal heart size. Trace pericardial effusion/thickening.

Mediastinum/Nodes: No discrete thyroid nodules. Unremarkable
esophagus. No pathologically enlarged axillary, mediastinal or hilar
lymph nodes.

Lungs/Pleura: No pneumothorax. No pleural effusion. Extensive patchy
peribronchovascular and peripheral consolidation throughout both
lungs involving all lung lobes, most prominent in the lower lobes
with associated air bronchograms. No discrete lung masses or
significant pulmonary nodules.

Upper abdomen: No acute abnormality.

Musculoskeletal:  No aggressive appearing focal osseous lesions.

Review of the MIP images confirms the above findings.
IMPRESSION: 1. No pulmonary embolism.
2. Extensive patchy consolidation throughout both lungs with
associated air bronchograms, compatible with severe multilobar
bronchopneumonia due to X2GH9-4S.
3. Trace pericardial effusion/thickening.

## 2022-03-13 IMAGING — DX DG CHEST 1V PORT
1 series · 1 of 1 positions shown · non-contrast
Comparison: 04/18/2020 chest radiograph.

CLINICAL DATA: Dyspnea, DZXHB-2S positive

EXAM:
PORTABLE CHEST 1 VIEW

[chest ap]
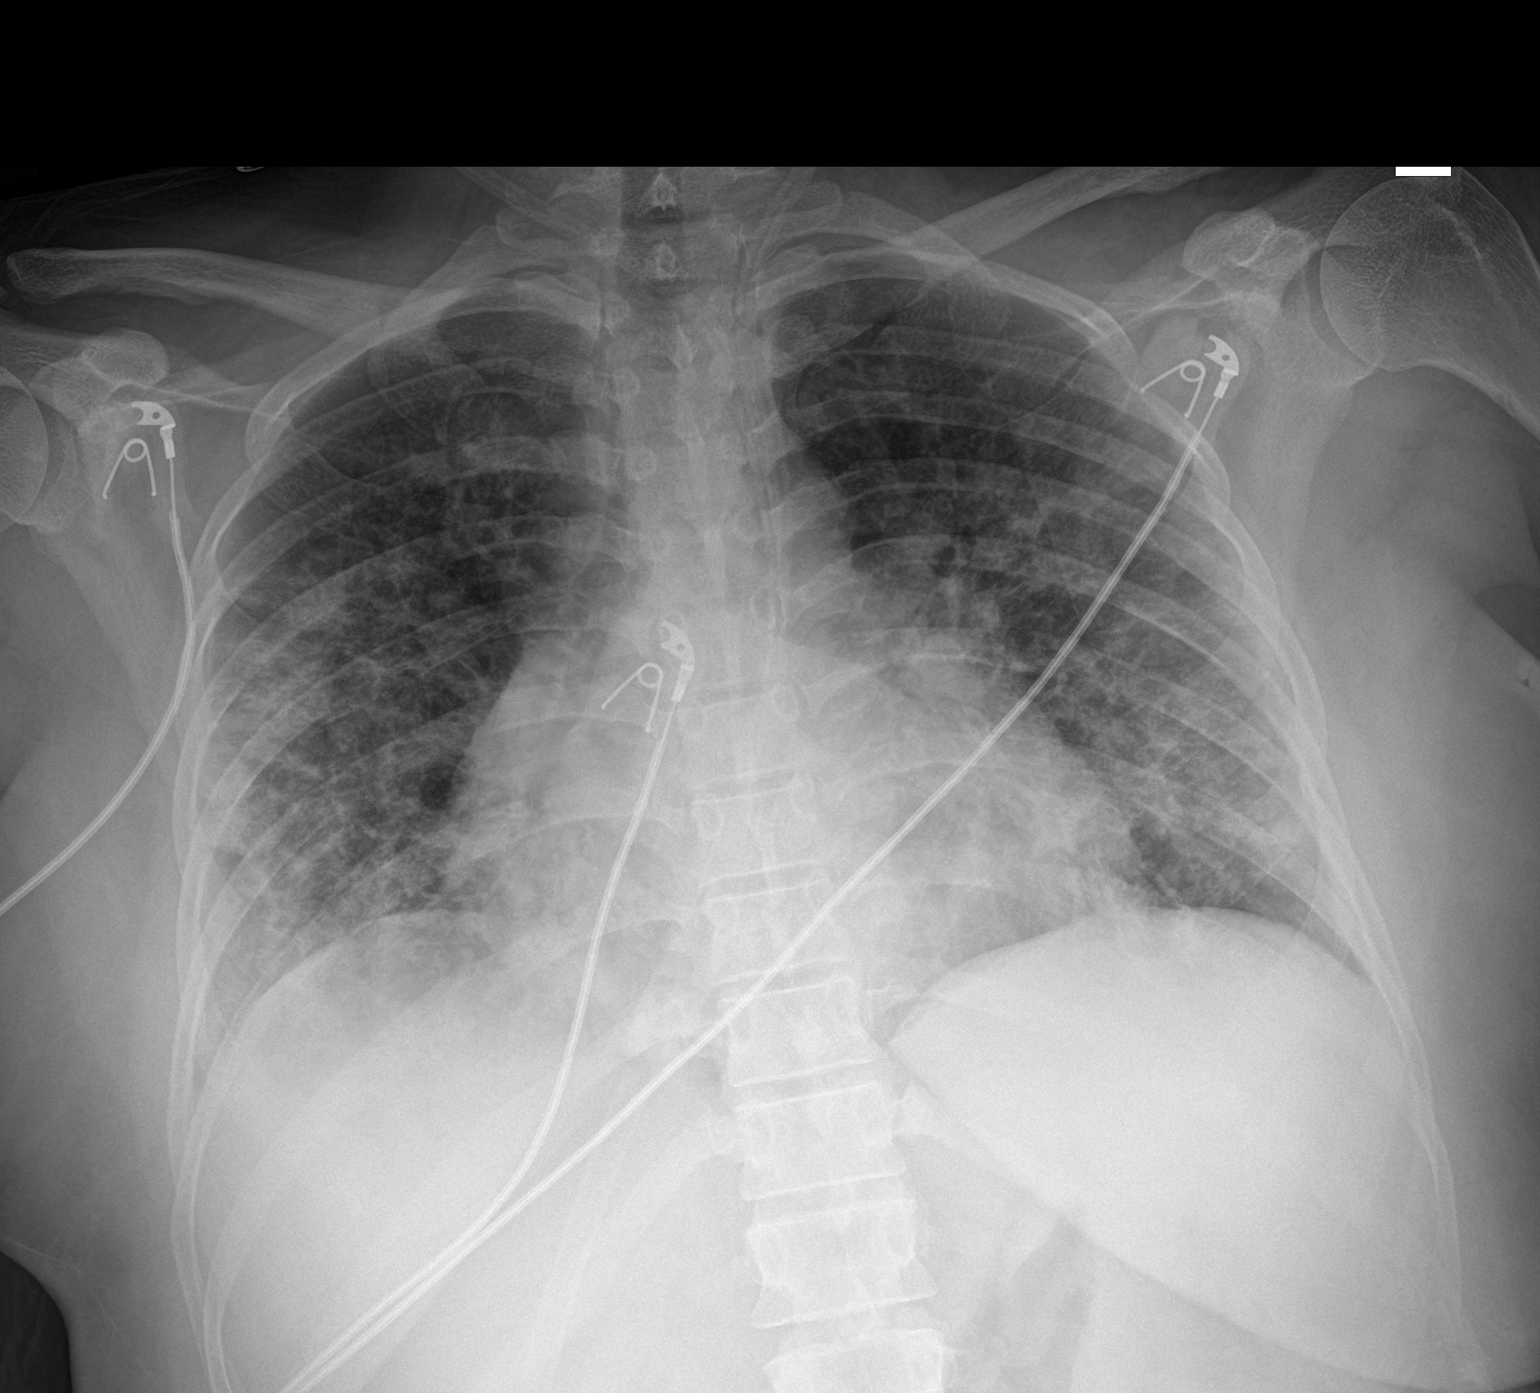

[1 of 1 positions shown; findings below may reference images not displayed]

FINDINGS: Stable cardiomediastinal silhouette with normal heart size. No
pneumothorax. No pleural effusion. Extensive patchy opacities
throughout both lungs, mildly improved on the right.
IMPRESSION: Extensive patchy opacities throughout both lungs, mildly improved on
the right, compatible with DZXHB-2S pneumonia.

## 2022-11-26 ENCOUNTER — Telehealth: Payer: Self-pay

## 2022-11-26 NOTE — Telephone Encounter (Signed)
Transition Care Management Unsuccessful Follow-up Telephone Call  Date of discharge and from where:  11/25/21  Attempts:  1st Attempt  Reason for unsuccessful TCM follow-up call:  Left voice message  Elyse Jarvis RMA
